# Patient Record
Sex: Male | Born: 1990 | Race: Black or African American | Hispanic: No | Marital: Single | Smoking: Current every day smoker
Health system: Southern US, Community
[De-identification: ages and names within clinical notes are randomized; demographics above are authoritative.]

## PROBLEM LIST (undated history)

## (undated) DIAGNOSIS — I10 Essential (primary) hypertension: Secondary | ICD-10-CM

## (undated) DIAGNOSIS — I639 Cerebral infarction, unspecified: Secondary | ICD-10-CM

## (undated) HISTORY — PX: NO PAST SURGERIES: SHX2092

---
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
## 2011-10-05 ENCOUNTER — Emergency Department: Payer: Self-pay | Admitting: Internal Medicine

## 2011-10-08 ENCOUNTER — Emergency Department: Payer: Self-pay | Admitting: Emergency Medicine

## 2015-02-27 ENCOUNTER — Encounter: Payer: Self-pay | Admitting: Student

## 2015-05-31 ENCOUNTER — Emergency Department
Admission: EM | Admit: 2015-05-31 | Discharge: 2015-05-31 | Disposition: A | Discharge status: Home / Self Care | Payer: Self-pay | Attending: Emergency Medicine | Admitting: Emergency Medicine

## 2015-05-31 ENCOUNTER — Encounter: Payer: Self-pay | Admitting: Emergency Medicine

## 2015-05-31 DIAGNOSIS — S29002A Unspecified injury of muscle and tendon of back wall of thorax, initial encounter: Secondary | ICD-10-CM | POA: Insufficient documentation

## 2015-05-31 DIAGNOSIS — I10 Essential (primary) hypertension: Secondary | ICD-10-CM | POA: Insufficient documentation

## 2015-05-31 DIAGNOSIS — Y990 Civilian activity done for income or pay: Secondary | ICD-10-CM | POA: Insufficient documentation

## 2015-05-31 DIAGNOSIS — Z720 Tobacco use: Secondary | ICD-10-CM | POA: Insufficient documentation

## 2015-05-31 DIAGNOSIS — Y9389 Activity, other specified: Secondary | ICD-10-CM | POA: Insufficient documentation

## 2015-05-31 DIAGNOSIS — M546 Pain in thoracic spine: Secondary | ICD-10-CM

## 2015-05-31 DIAGNOSIS — Y9289 Other specified places as the place of occurrence of the external cause: Secondary | ICD-10-CM | POA: Insufficient documentation

## 2015-05-31 DIAGNOSIS — X58XXXA Exposure to other specified factors, initial encounter: Secondary | ICD-10-CM | POA: Insufficient documentation

## 2015-05-31 LAB — CBC
HCT: 42.8 % (ref 40.0–52.0)
HEMOGLOBIN: 14.1 g/dL (ref 13.0–18.0)
MCH: 30.6 pg (ref 26.0–34.0)
MCHC: 32.9 g/dL (ref 32.0–36.0)
MCV: 93 fL (ref 80.0–100.0)
Platelets: 239 10*3/uL (ref 150–440)
RBC: 4.6 MIL/uL (ref 4.40–5.90)
RDW: 13.1 % (ref 11.5–14.5)
WBC: 6 10*3/uL (ref 3.8–10.6)

## 2015-05-31 LAB — BASIC METABOLIC PANEL
ANION GAP: 6 (ref 5–15)
BUN: 8 mg/dL (ref 6–20)
CALCIUM: 9.3 mg/dL (ref 8.9–10.3)
CO2: 28 mmol/L (ref 22–32)
CREATININE: 1.24 mg/dL (ref 0.61–1.24)
Chloride: 104 mmol/L (ref 101–111)
GFR calc non Af Amer: >60 mL/min (ref >60)
Glucose, Bld: 99 mg/dL (ref 65–99)
Potassium: 3.8 mmol/L (ref 3.5–5.1)
SODIUM: 138 mmol/L (ref 135–145)

## 2015-05-31 MED ORDER — NAPROXEN 500 MG PO TABS: 500.0000 mg | ORAL_TABLET | Freq: Two times a day (BID) | ORAL | Status: DC

## 2015-05-31 MED ORDER — LISINOPRIL 5 MG PO TABS
5.0000 mg | ORAL_TABLET | Freq: Once | ORAL | Status: AC
  Administered 2015-05-31: 5 mg via ORAL
  Filled 2015-05-31: qty 1

## 2015-05-31 MED ORDER — CYCLOBENZAPRINE HCL 10 MG PO TABS: 10.0000 mg | ORAL_TABLET | Freq: Three times a day (TID) | ORAL | Status: DC | PRN

## 2015-05-31 NOTE — ED Notes (Signed)
Patient to ED with c/o middle back pain, reports being sent here from work, reports doing a lot of lifting for work.

## 2015-05-31 NOTE — ED Provider Notes (Signed)
[facility] Emergency Department Provider Note  ____________________________________________  Time seen: On arrival  I have reviewed the triage vital signs and the nursing notes.   HISTORY  Chief Complaint Back Pain    HPI Tony Taylor is a 24 y.o. male who presents with complaints of back pain. He notes this developed at work this morning when he was lifting a pallet off of an elevated platform and twisting his back. He developed a sharp pain in the mid to lower back bilaterally which has not improved with stretching. He has no focal neuro deficits.I questioned him regarding his elevated blood pressure and he notes he has always had this but has not ever been put on medications for it. He denies chest pain, no headache.    History reviewed. No pertinent past medical history.  There are no active problems to display for this patient.   History reviewed. No pertinent past surgical history.  Current Outpatient Rx  Name  Route  Sig  Dispense  Refill  . cyclobenzaprine (FLEXERIL) 10 MG tablet   Oral   Take 1 tablet (10 mg total) by mouth every 8 (eight) hours as needed for muscle spasms.   20 tablet   1   . naproxen (NAPROSYN) 500 MG tablet   Oral   Take 1 tablet (500 mg total) by mouth 2 (two) times daily with a meal.   20 tablet   2     Allergies Review of patient's allergies indicates not on file.  History reviewed. No pertinent family history.  Social History Social History  Substance Use Topics  . Smoking status: Current Some Day Smoker  . Smokeless tobacco: None  . Alcohol Use: None    Review of Systems  Constitutional: Negative for fever. Eyes: Negative for visual changes. ENT: Negative for sore throat Musculoskeletal: Positive for back pain Skin: Negative for rash. Neurological: Negative for headaches or focal weakness   ____________________________________________   PHYSICAL EXAM:  VITAL SIGNS: ED Triage Vitals   Enc Vitals Group     BP 05/31/15 0934 197/119 mmHg     Pulse Rate 05/31/15 0934 58     Resp 05/31/15 0934 20     Temp 05/31/15 0934 98.3 F (36.8 C)     Temp Source 05/31/15 0934 Oral     SpO2 05/31/15 0934 100 %     Weight 05/31/15 0934 280 lb (127.007 kg)     Height 05/31/15 0934 6\' 5"  (1.956 m)     Head Cir --      Peak Flow --      Pain Score 05/31/15 1102 9     Pain Loc --      Pain Edu? --      Excl. in GC? --      Constitutional: Alert and oriented. Well appearing and in no distress. Eyes: Conjunctivae are normal.  ENT   Head: Normocephalic and atraumatic.   Mouth/Throat: Mucous membranes are moist. Cardiovascular: Normal rate, regular rhythm.  Respiratory: Normal respiratory effort without tachypnea nor retractions.  Gastrointestinal: Soft and non-tender in all quadrants. No distention. There is no CVA tenderness. Musculoskeletal: Nontender with normal range of motion in all extremities. Patient has tenderness bilateral inferior latissimus muscles. Full range of motion. No vertebral tenderness to palpation. Normal neuro exam Neurologic:  Normal speech and language. No gross focal neurologic deficits are appreciated. Skin:  Skin is warm, dry and intact. No rash noted. Psychiatric: Mood and affect are normal. Patient exhibits  appropriate insight and judgment.  ____________________________________________    LABS (pertinent positives/negatives)  Labs Reviewed  CBC  BASIC METABOLIC PANEL    ____________________________________________     ____________________________________________    RADIOLOGY I have personally reviewed any xrays that were ordered on this patient: None  ____________________________________________   PROCEDURES  Procedure(s) performed: none   ____________________________________________   INITIAL IMPRESSION / ASSESSMENT AND PLAN / ED COURSE  Pertinent labs & imaging results that were available during my care of the  patient were reviewed by me and considered in my medical decision making (see chart for details).  Patient with likely thoracic back strain. More concerning is his significantly elevated blood pressure. I had a long discussion with him regarding need for further treatment and how I will start him on antihypertensives here in the emergency department but he will need close follow-up. He agrees to follow-up closely  ____________________________________________   FINAL CLINICAL IMPRESSION(S) / ED DIAGNOSES  Final diagnoses:  Bilateral thoracic back pain  Essential hypertension     Jene Every, MD 05/31/15 [phone]

## 2015-05-31 NOTE — Discharge Instructions (Signed)
Back Pain, Adult °Low back pain is very common. About 1 in 5 people have back pain. The cause of low back pain is rarely dangerous. The pain often gets better over time. About half of people with a sudden onset of back pain feel better in just 2 weeks. About 8 in 10 people feel better by 6 weeks.  °CAUSES °Some common causes of back pain include: °· Strain of the muscles or ligaments supporting the spine. °· Wear and tear (degeneration) of the spinal discs. °· Arthritis. °· Direct injury to the back. °DIAGNOSIS °Most of the time, the direct cause of low back pain is not known. However, back pain can be treated effectively even when the exact cause of the pain is unknown. Answering your caregiver's questions about your overall health and symptoms is one of the most accurate ways to make sure the cause of your pain is not dangerous. If your caregiver needs more information, he or she may order lab work or imaging tests (X-rays or MRIs). However, even if imaging tests show changes in your back, this usually does not require surgery. °HOME CARE INSTRUCTIONS °For many people, back pain returns. Since low back pain is rarely dangerous, it is often a condition that people can learn to manage on their own.  °· Remain active. It is stressful on the back to sit or stand in one place. Do not sit, drive, or stand in one place for more than 30 minutes at a time. Take short walks on level surfaces as soon as pain allows. Try to increase the length of time you walk each day. °· Do not stay in bed. Resting more than 1 or 2 days can delay your recovery. °· Do not avoid exercise or work. Your body is made to move. It is not dangerous to be active, even though your back may hurt. Your back will likely heal faster if you return to being active before your pain is gone. °· Pay attention to your body when you  bend and lift. Many people have less discomfort when lifting if they bend their knees, keep the load close to their bodies, and  avoid twisting. Often, the most comfortable positions are those that put less stress on your recovering back. °· Find a comfortable position to sleep. Use a firm mattress and lie on your side with your knees slightly bent. If you lie on your back, put a pillow under your knees. °· Only take over-the-counter or prescription medicines as directed by your caregiver. Over-the-counter medicines to reduce pain and inflammation are often the most helpful. Your caregiver may prescribe muscle relaxant drugs. These medicines help dull your pain so you can more quickly return to your normal activities and healthy exercise. °· Put ice on the injured area. °¨ Put ice in a plastic bag. °¨ Place a towel between your skin and the bag. °¨ Leave the ice on for 15-20 minutes, 03-04 times a day for the first 2 to 3 days. After that, ice and heat may be alternated to reduce pain and spasms. °· Ask your caregiver about trying back exercises and gentle massage. This may be of some benefit. °· Avoid feeling anxious or stressed. Stress increases muscle tension and can worsen back pain. It is important to recognize when you are anxious or stressed and learn ways to manage it. Exercise is a great option. °SEEK MEDICAL CARE IF: °· You have pain that is not relieved with rest or medicine. °· You have pain that does not improve in 1 week. °· You have new symptoms. °· You are generally not feeling well. °SEEK   IMMEDIATE MEDICAL CARE IF:  °· You have pain that radiates from your back into your legs. °· You develop new bowel or bladder control problems. °· You have unusual weakness or numbness in your arms or legs. °· You develop nausea or vomiting. °· You develop abdominal pain. °· You feel faint. °Document Released: 09/29/2005 Document Revised: 03/30/2012 Document Reviewed: 01/31/2014 °ExitCare® Patient Information ©2015 [employer]. This information is not intended to replace advice given to you by your health care provider. Make sure you  discuss any questions you have with your health care provider. °Hypertension °Hypertension, commonly called high blood pressure, is when the force of blood pumping through your arteries is too strong. Your arteries are the blood vessels that carry blood from your heart throughout your body. A blood pressure reading consists of a higher number over a lower number, such as 110/72. The higher number (systolic) is the pressure inside your arteries when your heart pumps. The lower number (diastolic) is the pressure inside your arteries when your heart relaxes. Ideally you want your blood pressure below 120/80. °Hypertension forces your heart to work harder to pump blood. Your arteries may become narrow or stiff. Having hypertension puts you at risk for heart disease, stroke, and other problems.  °RISK FACTORS °Some risk factors for high blood pressure are controllable. Others are not.  °Risk factors you cannot control include:  °· Race. You may be at higher risk if you are African American. °· Age. Risk increases with age. °· Gender. Men are at higher risk than women before age 45 years. After age 65, women are at higher risk than men. °Risk factors you can control include: °· Not getting enough exercise or physical activity. °· Being overweight. °· Getting too much fat, sugar, calories, or salt in your diet. °· Drinking too much alcohol. °SIGNS AND SYMPTOMS °Hypertension does not usually cause signs or symptoms. Extremely high blood pressure (hypertensive crisis) may cause headache, anxiety, shortness of breath, and nosebleed. °DIAGNOSIS  °To check if you have hypertension, your health care provider will measure your blood pressure while you are seated, with your arm held at the level of your heart. It should be measured at least twice using the same arm. Certain conditions can cause a difference in blood pressure between your right and left arms. A blood pressure reading that is higher than normal on one occasion does  not mean that you need treatment. If one blood pressure reading is high, ask your health care provider about having it checked again. °TREATMENT  °Treating high blood pressure includes making lifestyle changes and possibly taking medicine. Living a healthy lifestyle can help lower high blood pressure. You may need to change some of your habits. °Lifestyle changes may include: °· Following the DASH diet. This diet is high in fruits, vegetables, and whole grains. It is low in salt, red meat, and added sugars. °· Getting at least 2½ hours of brisk physical activity every week. °· Losing weight if necessary. °· Not smoking. °· Limiting alcoholic beverages. °· Learning ways to reduce stress. ° If lifestyle changes are not enough to get your blood pressure under control, your health care provider may prescribe medicine. You may need to take more than one. Work closely with your health care provider to understand the risks and benefits. °HOME CARE INSTRUCTIONS °· Have your blood pressure rechecked as directed by your health care provider.   °· Take medicines only as directed by your health care provider. Follow the directions carefully. Blood   pressure medicines must be taken as prescribed. The medicine does not work as well when you skip doses. Skipping doses also puts you at risk for problems.   °· Do not smoke.   °· Monitor your blood pressure at home as directed by your health care provider.  °SEEK MEDICAL CARE IF:  °· You think you are having a reaction to medicines taken. °· You have recurrent headaches or feel dizzy. °· You have swelling in your ankles. °· You have trouble with your vision. °SEEK IMMEDIATE MEDICAL CARE IF: °· You develop a severe headache or confusion. °· You have unusual weakness, numbness, or feel faint. °· You have severe chest or abdominal pain. °· You vomit repeatedly. °· You have trouble breathing. °MAKE SURE YOU:  °· Understand these instructions. °· Will watch your condition. °· Will get help  right away if you are not doing well or get worse. °Document Released: 09/29/2005 Document Revised: 02/13/2014 Document Reviewed: 07/22/2013 °ExitCare® Patient Information ©2015 [employer]. This information is not intended to replace advice given to you by your health care provider. Make sure you discuss any questions you have with your health care provider. ° °

## 2015-07-16 ENCOUNTER — Other Ambulatory Visit: Payer: Self-pay

## 2015-07-16 ENCOUNTER — Inpatient Hospital Stay
Admission: EM | Admit: 2015-07-16 | Discharge: 2015-07-17 | DRG: 065 | Disposition: A | Discharge status: Home / Self Care | Payer: Self-pay | Attending: Internal Medicine | Admitting: Internal Medicine

## 2015-07-16 ENCOUNTER — Emergency Department: Payer: Self-pay

## 2015-07-16 DIAGNOSIS — E785 Hyperlipidemia, unspecified: Secondary | ICD-10-CM | POA: Diagnosis present

## 2015-07-16 DIAGNOSIS — G8194 Hemiplegia, unspecified affecting left nondominant side: Secondary | ICD-10-CM | POA: Diagnosis present

## 2015-07-16 DIAGNOSIS — I639 Cerebral infarction, unspecified: Principal | ICD-10-CM | POA: Diagnosis present

## 2015-07-16 DIAGNOSIS — I161 Hypertensive emergency: Secondary | ICD-10-CM

## 2015-07-16 DIAGNOSIS — R531 Weakness: Secondary | ICD-10-CM

## 2015-07-16 DIAGNOSIS — Z8673 Personal history of transient ischemic attack (TIA), and cerebral infarction without residual deficits: Secondary | ICD-10-CM

## 2015-07-16 DIAGNOSIS — F17210 Nicotine dependence, cigarettes, uncomplicated: Secondary | ICD-10-CM | POA: Diagnosis present

## 2015-07-16 DIAGNOSIS — I10 Essential (primary) hypertension: Secondary | ICD-10-CM | POA: Diagnosis present

## 2015-07-16 DIAGNOSIS — M545 Low back pain: Secondary | ICD-10-CM | POA: Diagnosis present

## 2015-07-16 HISTORY — DX: Cerebral infarction, unspecified: I63.9

## 2015-07-16 HISTORY — DX: Essential (primary) hypertension: I10

## 2015-07-16 LAB — COMPREHENSIVE METABOLIC PANEL
ALK PHOS: 91 U/L (ref 38–126)
ALT: 23 U/L (ref 17–63)
ANION GAP: 8 (ref 5–15)
AST: 19 U/L (ref 15–41)
Albumin: 4.3 g/dL (ref 3.5–5.0)
BUN: 12 mg/dL (ref 6–20)
CALCIUM: 9.2 mg/dL (ref 8.9–10.3)
CHLORIDE: 102 mmol/L (ref 101–111)
CO2: 26 mmol/L (ref 22–32)
Creatinine, Ser: 1.25 mg/dL — ABNORMAL HIGH (ref 0.61–1.24)
GFR calc non Af Amer: >60 mL/min (ref >60)
Glucose, Bld: 94 mg/dL (ref 65–99)
Potassium: 3.7 mmol/L (ref 3.5–5.1)
SODIUM: 136 mmol/L (ref 135–145)
Total Bilirubin: 0.7 mg/dL (ref 0.3–1.2)
Total Protein: 7.3 g/dL (ref 6.5–8.1)

## 2015-07-16 LAB — CBC
HCT: 45.6 % (ref 40.0–52.0)
Hemoglobin: 15.3 g/dL (ref 13.0–18.0)
MCH: 30.5 pg (ref 26.0–34.0)
MCHC: 33.5 g/dL (ref 32.0–36.0)
MCV: 91 fL (ref 80.0–100.0)
PLATELETS: 235 10*3/uL (ref 150–440)
RBC: 5.01 MIL/uL (ref 4.40–5.90)
RDW: 12.7 % (ref 11.5–14.5)
WBC: 7.3 10*3/uL (ref 3.8–10.6)

## 2015-07-16 LAB — PROTIME-INR
INR: 0.93
PROTHROMBIN TIME: 12.7 s (ref 11.4–15.0)

## 2015-07-16 LAB — DIFFERENTIAL
BASOS PCT: 1 %
Basophils Absolute: 0.1 10*3/uL (ref 0–0.1)
EOS PCT: 3 %
Eosinophils Absolute: 0.2 10*3/uL (ref 0–0.7)
Lymphocytes Relative: 25 %
Lymphs Abs: 1.8 10*3/uL (ref 1.0–3.6)
MONO ABS: 0.5 10*3/uL (ref 0.2–1.0)
Monocytes Relative: 8 %
Neutro Abs: 4.6 10*3/uL (ref 1.4–6.5)
Neutrophils Relative %: 63 %

## 2015-07-16 LAB — TROPONIN I

## 2015-07-16 LAB — APTT: aPTT: 29 seconds (ref 24–36)

## 2015-07-16 MED ORDER — STROKE: EARLY STAGES OF RECOVERY BOOK
Freq: Once | Status: AC
  Administered 2015-07-17

## 2015-07-16 MED ORDER — ACETAMINOPHEN 325 MG PO TABS: 650.0000 mg | ORAL_TABLET | Freq: Four times a day (QID) | ORAL | Status: DC | PRN

## 2015-07-16 MED ORDER — SODIUM CHLORIDE 0.9 % IJ SOLN
3.0000 mL | Freq: Two times a day (BID) | INTRAMUSCULAR | Status: DC
  Administered 2015-07-17 (×2): 3 mL via INTRAVENOUS

## 2015-07-16 MED ORDER — LABETALOL HCL 5 MG/ML IV SOLN
20.0000 mg | Freq: Once | INTRAVENOUS | Status: AC
  Administered 2015-07-16: 20 mg via INTRAVENOUS
  Filled 2015-07-16: qty 4

## 2015-07-16 MED ORDER — ENOXAPARIN SODIUM 40 MG/0.4ML ~~LOC~~ SOLN
40.0000 mg | SUBCUTANEOUS | Status: DC
  Administered 2015-07-17: 40 mg via SUBCUTANEOUS
  Filled 2015-07-16: qty 0.4

## 2015-07-16 MED ORDER — ACETAMINOPHEN 650 MG RE SUPP: 650.0000 mg | Freq: Four times a day (QID) | RECTAL | Status: DC | PRN

## 2015-07-16 MED ORDER — LABETALOL HCL 5 MG/ML IV SOLN
10.0000 mg | INTRAVENOUS | Status: DC | PRN
Start: 2015-07-16 — End: 2015-07-17
  Filled 2015-07-16: qty 4

## 2015-07-16 NOTE — ED Notes (Signed)
Pt c/o waking on Saturday morning with numbness to left side of body, states since it has progress with some difficulty with grip and ambulating per pt, pt ambulatory to triage without difficulty. No facial/neuro deficits noted in triage..denies HA blurred vision or other sx.Marland Kitchen

## 2015-07-16 NOTE — ED Notes (Signed)
Pt to ed with c/o weakness and numbness to his left side. Pt states his grip is weak and feels like his left arm and leg are "asleep". Pt states "my speech is also off", pt states "my blood pressure is high and and I'm worried I had a stroke in my sleep 2 nights ago" pt states his mom recently had a stroke and is worried he has the same fate.

## 2015-07-16 NOTE — Plan of Care (Signed)
Problem: Discharge/Transitional Outcomes Goal: Barriers To Progression Addressed/Resolved Individualization: Pt prefers to be called Tony Taylor who lives at home with his father. Mother lives in assisted living due to multiple strokes in the past. Hx HTN but does not take any home medications. Low fall risk. Pt understands how to use call system for assistance.

## 2015-07-16 NOTE — ED Notes (Signed)
Pt to 1C via stretcher w/portable telemetry monitor on.

## 2015-07-16 NOTE — ED Provider Notes (Signed)
[facility] Emergency Department Provider Note   ____________________________________________  Time seen: 7:30 PM I have reviewed the triage vital signs and the triage nursing note.  HISTORY  Chief Complaint Stroke Symptoms   Historian Patient, with dad at bedside  HPI Tony Taylor is a 24 y.o. male who reports being diagnosed with elevated blood pressure couple months ago when he was having low back pain, and there was thought that he was having elevated blood pressure due to pain. He is here today for evaluation of tingling in the left arm and left leg, decreased grip strength and trouble walking due to weakness in the left leg. He woke up Saturday morning with the symptoms. Symptoms have not gone away. They're not waxing and waning. No headache. No chest pain or trouble breathing. No urinary symptoms. Mother had a stroke in her 21s.    Past Medical History  Diagnosis Date  . Hypertension     There are no active problems to display for this patient.   History reviewed. No pertinent past surgical history.  Current Outpatient Rx  Name  Route  Sig  Dispense  Refill  . cyclobenzaprine (FLEXERIL) 10 MG tablet   Oral   Take 1 tablet (10 mg total) by mouth every 8 (eight) hours as needed for muscle spasms.   20 tablet   1   . naproxen (NAPROSYN) 500 MG tablet   Oral   Take 1 tablet (500 mg total) by mouth 2 (two) times daily with a meal.   20 tablet   2     Allergies Review of patient's allergies indicates no known allergies.  No family history on file.  Social History Social History  Substance Use Topics  . Smoking status: Current Every Day Smoker    Types: Cigarettes  . Smokeless tobacco: Never Used  . Alcohol Use: No    Review of Systems  Constitutional: Negative for fever. Eyes: Negative for visual changes. ENT: Negative for sore throat. Cardiovascular: Negative for chest pain. Respiratory: Negative for shortness of  breath. Gastrointestinal: Negative for abdominal pain, vomiting and diarrhea. Genitourinary: Negative for dysuria. Musculoskeletal: Negative for back pain. Skin: Negative for rash. Neurological: Negative for headache. 10 point Review of Systems otherwise negative ____________________________________________   PHYSICAL EXAM:  VITAL SIGNS: ED Triage Vitals  Enc Vitals Group     BP 07/16/15 1654 190/116 mmHg     Pulse Rate 07/16/15 1654 68     Resp 07/16/15 1654 18     Temp 07/16/15 1654 97.8 F (36.6 C)     Temp Source 07/16/15 1654 Oral     SpO2 07/16/15 1654 100 %     Weight 07/16/15 1654 280 lb (127.007 kg)     Height 07/16/15 1654  (1.956 m)     Head Cir --      Peak Flow --      Pain Score --      Pain Loc --      Pain Edu? --      Excl. in GC? --      Constitutional: Alert and oriented. Well appearing and in no distress. Eyes: Conjunctivae are normal. PERRL. Normal extraocular movements. ENT   Head: Normocephalic and atraumatic.   Nose: No congestion/rhinnorhea.   Mouth/Throat: Mucous membranes are moist.   Neck: No stridor. Cardiovascular/Chest: Normal rate, regular rhythm.  No murmurs, rubs, or gallops. Respiratory: Normal respiratory effort without tachypnea nor retractions. Breath sounds are clear and equal bilaterally.  No wheezes/rales/rhonchi. Gastrointestinal: Soft. No distention, no guarding, no rebound. Nontender   Genitourinary/rectal:Deferred Musculoskeletal: Nontender with normal range of motion in all extremities. No joint effusions.  No lower extremity tenderness.  No edema. Neurologic:  Normal speech and language. No aphasia or dysarthria. 4 out of 5 strength in the left upper extremity triceps. 5 out of 5 strength on exam in 3 other extremities. No facial droop.  Cranial nerves II through X intact. No sensory changes. Skin:  Skin is warm, dry and intact. No rash noted. Psychiatric: Mood and affect are normal. Speech and behavior  are normal. Patient exhibits appropriate insight and judgment.  ____________________________________________   EKG I, Governor Rooks, MD, the attending physician have personally viewed and interpreted all ECGs.  56 bpm. Sinus bradycardia. Narrow QRS. LVH. Nonspecific T wave. ____________________________________________  LABS (pertinent positives/negatives)  INR 0.93 CBC within normal limits Comprehensive metabolic panel significant for creatinine 1.25 Troponin less than 0.03  ____________________________________________  RADIOLOGY All Xrays were viewed by me. Imaging interpreted by Radiologist.  CT head noncontrast: No acute intracranial pathology __________________________________________  PROCEDURES  Procedure(s) performed: None  Critical Care performed: CRITICAL CARE Performed by: Governor Rooks   Total critical care time: 30 minutes  Critical care time was exclusive of separately billable procedures and treating other patients.  Critical care was necessary to treat or prevent imminent or life-threatening deterioration.  Critical care was time spent personally by me on the following activities: development of treatment plan with patient and/or surrogate as well as nursing, discussions with consultants, evaluation of patient's response to treatment, examination of patient, obtaining history from patient or surrogate, ordering and performing treatments and interventions, ordering and review of laboratory studies, ordering and review of radiographic studies, pulse oximetry and re-evaluation of patient's condition.   ____________________________________________   ED COURSE / ASSESSMENT AND PLAN  CONSULTATIONS: Hospitalist for admission  Pertinent labs & imaging results that were available during my care of the patient were reviewed by me and considered in my medical decision making (see chart for details).  Patient arrived room with complaint of neurologic deficit,  which was onset 2 days ago.  CT head negative for sign of stroke, bleed, or ischemic stroke. Patient would not be a TPA candidate due to minor symptoms, and length of symptoms if this were an ischemic stroke. Patient's blood pressure is significantly elevated, and I suspect the patient's neurologic symptoms may be coming from elevated blood pressure consistent with hypertensive emergency. Patient was given IV labetalol 20 mg in the emergency department. We discussed dropping the blood pressure by 25%. He will be admitted for management of blood pressure and further evaluation of the neurologic symptoms.  Patient's creatinine is also minimally elevated at 1.27. This may be a result of chronic/prolonged hypertension as well.  Patient's blood pressure down to 150/90 after one dose of labetalol 20 mg IV. Patient still symptomatic with left-sided weakness. I discussed with the hospitalist for admission.  Patient / Family / Caregiver informed of clinical course, medical decision-making process, and agree with plan.    ___________________________________________   FINAL CLINICAL IMPRESSION(S) / ED DIAGNOSES   Final diagnoses:  Hypertensive emergency  Left-sided weakness       Governor Rooks, MD 07/16/15 2044

## 2015-07-16 NOTE — H&P (Signed)
[facility] [place] at [facility]   PATIENT NAME: Tony Taylor    MR#:  [id number]  DATE OF BIRTH:  03/17/1991  DATE OF ADMISSION:  07/16/2015  PRIMARY CARE PHYSICIAN: No PCP Per Patient   REQUESTING/REFERRING PHYSICIAN: Shaune Pollack, M.D.  CHIEF COMPLAINT:   Chief Complaint  Patient presents with  . Stroke Symptoms    HISTORY OF PRESENT ILLNESS:  Tony Taylor  is a 24 y.o. male who presents with left hemiparesis. Patient states that he began to notice subtle left leg and arm weakness 3 days ago. His symptoms progressed each day feeling a little bit weaker he states that currently his left arm is noticeably more weaker compared to his left leg. He also says that he seemed to have a little bit of difficulty with his speech, that he feels he is speaking a little slower and having to concentrate a little bit more to pronounce his words clearly. He denies any dysphagia, denies any acute headache, visual changes, sensory deficits. See full review of systems below. He also denies any infectious symptoms. Has a history only of hypertension, not currently treated with any medications. In the ED workup is largely benign, including CT head. However, on exam he does still have some perceptible left-sided weakness, so hospitalists were called for admission. His blood pressure is also significantly elevated in the ED.  PAST MEDICAL HISTORY:   Past Medical History  Diagnosis Date  . Hypertension     PAST SURGICAL HISTORY:   Past Surgical History  Procedure Laterality Date  . No past surgeries      SOCIAL HISTORY:   Social History  Substance Use Topics  . Smoking status: Current Every Day Smoker    Types: Cigarettes  . Smokeless tobacco: Never Used  . Alcohol Use: No    FAMILY HISTORY:   Family History  Problem Relation Age of Onset  . Stroke Mother     DRUG ALLERGIES:  No Known Allergies  MEDICATIONS AT HOME:   Prior to Admission medications   Not  on File    REVIEW OF SYSTEMS:  Review of Systems  Constitutional: Negative for fever, chills, weight loss and malaise/fatigue.  HENT: Negative for ear pain, hearing loss and tinnitus.   Eyes: Negative for blurred vision, double vision, pain and redness.  Respiratory: Negative for cough, hemoptysis and shortness of breath.   Cardiovascular: Negative for chest pain, palpitations, orthopnea and leg swelling.  Gastrointestinal: Negative for nausea, vomiting, abdominal pain, diarrhea and constipation.  Genitourinary: Negative for dysuria, frequency and hematuria.  Musculoskeletal: Negative for back pain, joint pain and neck pain.  Skin:       No acne, rash, or lesions  Neurological: Positive for speech change and focal weakness. Negative for dizziness, tremors and weakness.  Endo/Heme/Allergies: Negative for polydipsia. Does not bruise/bleed easily.  Psychiatric/Behavioral: Negative for depression. The patient is not nervous/anxious and does not have insomnia.      VITAL SIGNS:   Filed Vitals:   07/16/15 1911 07/16/15 1930 07/16/15 2038 07/16/15 2100  BP: 184/127 162/94 142/98 166/110  Pulse: 62 94 66 59  Temp: 98 F (36.7 C)  98.3 F (36.8 C)   TempSrc:      Resp: Height:      Weight:      SpO2: 100% 100% 100% 100%   Wt Readings from Last 3 Encounters:  07/16/15 127.007 kg (280 lb)  05/31/15 127.007 kg (280 lb)  PHYSICAL EXAMINATION:  Physical Exam  Vitals reviewed. Constitutional: He is oriented to person, place, and time. He appears well-developed and well-nourished. No distress.  HENT:  Head: Normocephalic and atraumatic.  Mouth/Throat: Oropharynx is clear and moist.  Eyes: Conjunctivae and EOM are normal. Pupils are equal, round, and reactive to light. No scleral icterus.  Neck: Normal range of motion. Neck supple. No JVD present. No thyromegaly present.  Cardiovascular: Normal rate, regular rhythm and intact distal pulses.  Exam reveals no gallop and  no friction rub.   No murmur heard. Respiratory: Effort normal and breath sounds normal. No respiratory distress. He has no wheezes. He has no rales.  GI: Soft. Bowel sounds are normal. He exhibits no distension. There is no tenderness.  Musculoskeletal: Normal range of motion. He exhibits no edema.  No arthritis, no gout  Lymphadenopathy:    He has no cervical adenopathy.  Neurological: He is alert and oriented to person, place, and time.  Neurologic: Cranial nerves II-XII intact, Sensation intact to light touch/pinprick, 5/5 strength in right extremities, 4/5 strength left upper extremity, 5/5 strength left lower extremity though very subtly weaker compared to the right, no dysarthria, no aphasia, no dysphagia, memory intact, finger to nose testing showed no abnormality, very subtle left pronator drift, DTR intact, Babinski sign not present, gait deferred   Skin: Skin is warm and dry. No rash noted. No erythema.  Psychiatric: He has a normal mood and affect. His behavior is normal. Judgment and thought content normal.    LABORATORY PANEL:   CBC  Recent Labs Lab 07/16/15 1706  WBC 7.3  HGB 15.3  HCT 45.6  PLT 235   ------------------------------------------------------------------------------------------------------------------  Chemistries   Recent Labs Lab 07/16/15 1706  NA 136  K 3.7  CL 102  CO2 26  GLUCOSE 94  BUN 12  CREATININE 1.25*  CALCIUM 9.2  AST 19  ALT 23  ALKPHOS 91  BILITOT 0.7   ------------------------------------------------------------------------------------------------------------------  Cardiac Enzymes  Recent Labs Lab 07/16/15 1706  TROPONINI <0.03   ------------------------------------------------------------------------------------------------------------------  RADIOLOGY:  Ct Head Wo Contrast  07/16/2015   CLINICAL DATA:  Left-sided numbness for 2 days  EXAM: CT HEAD WITHOUT CONTRAST  TECHNIQUE: Contiguous axial images were  obtained from the base of the skull through the vertex without intravenous contrast.  COMPARISON:  None.  FINDINGS: Brain parenchyma and ventricular system are within normal limits. No mass effect, midline shift, or acute hemorrhage. Mastoid air cells are clear. Paranasal sinuses are clear. Adenoidal lymphoid tissue is moderately prominent.  IMPRESSION: No acute intracranial pathology.   Electronically Signed   By: Jolaine Click M.D.   On: 07/16/2015 17:13    EKG:   Orders placed or performed during the hospital encounter of 07/16/15  . ED EKG  . ED EKG    IMPRESSION AND PLAN:  Principal Problem:   Left hemiparesis (HCC) - concern is for possible stroke. Patient is very young, however he does have persistent hemiparesis on the left side, even if mild. Lab workup largely benign. We'll order MRI/MRA head with MRA neck, fasting lipid panel, hemoglobin A1c, echocardiogram, PT/OT/speech therapy, and neurology consult. Active Problems:   Accelerated hypertension - given the patient is greater than 24 hours out from onset of symptoms, we will control his blood pressure to a goal of less than 160/100. Continues IV medications when necessary to control this for now. He will likely need initiation of by mouth antihypertensive.  All the records are reviewed and case discussed with  ED provider. Management plans discussed with the patient and/or family.  DVT PROPHYLAXIS: SubQ lovenox  ADMISSION STATUS: Inpatient  CODE STATUS: Full  TOTAL TIME TAKING CARE OF THIS PATIENT: 45 minutes.    Alonzo Loving FIELDING 07/16/2015, 10:14 PM  [employer] [facility]  Office  [phone]  CC: Primary care physician; No PCP Per Patient

## 2015-07-17 ENCOUNTER — Inpatient Hospital Stay
Admit: 2015-07-17 | Discharge: 2015-07-17 | Disposition: A | Discharge status: Home / Self Care | Payer: Self-pay | Attending: Internal Medicine | Admitting: Internal Medicine

## 2015-07-17 ENCOUNTER — Encounter: Payer: Self-pay | Admitting: Internal Medicine

## 2015-07-17 ENCOUNTER — Inpatient Hospital Stay: Payer: Self-pay

## 2015-07-17 LAB — LIPID PANEL
CHOL/HDL RATIO: 4.9 ratio
CHOLESTEROL: 196 mg/dL (ref 0–200)
HDL: 40 mg/dL — ABNORMAL LOW (ref >40)
LDL Cholesterol: 137 mg/dL — ABNORMAL HIGH (ref 0–99)
Triglycerides: 97 mg/dL (ref <150)
VLDL: 19 mg/dL (ref 0–40)

## 2015-07-17 LAB — CBC
HCT: 44 % (ref 40.0–52.0)
Hemoglobin: 14.9 g/dL (ref 13.0–18.0)
MCH: 30.8 pg (ref 26.0–34.0)
MCHC: 33.9 g/dL (ref 32.0–36.0)
MCV: 90.8 fL (ref 80.0–100.0)
PLATELETS: 203 10*3/uL (ref 150–440)
RBC: 4.84 MIL/uL (ref 4.40–5.90)
RDW: 12.8 % (ref 11.5–14.5)
WBC: 7.2 10*3/uL (ref 3.8–10.6)

## 2015-07-17 LAB — TROPONIN I

## 2015-07-17 LAB — VITAMIN B12: VITAMIN B 12: 126 pg/mL — AB (ref 180–914)

## 2015-07-17 LAB — HEMOGLOBIN A1C: Hgb A1c MFr Bld: 5.4 % (ref 4.0–6.0)

## 2015-07-17 MED ORDER — AMLODIPINE BESYLATE 5 MG PO TABS
5.0000 mg | ORAL_TABLET | Freq: Every day | ORAL | Status: DC
  Administered 2015-07-17: 5 mg via ORAL
  Filled 2015-07-17: qty 1

## 2015-07-17 MED ORDER — AMLODIPINE BESYLATE 5 MG PO TABS: 10.0000 mg | ORAL_TABLET | Freq: Every day | ORAL | Status: DC

## 2015-07-17 MED ORDER — ATORVASTATIN CALCIUM 10 MG PO TABS: 40.0000 mg | ORAL_TABLET | Freq: Every day | ORAL | Status: DC

## 2015-07-17 MED ORDER — ASPIRIN 81 MG PO CHEW: 81.0000 mg | CHEWABLE_TABLET | Freq: Every day | ORAL | Status: DC

## 2015-07-17 MED ORDER — ATORVASTATIN CALCIUM 20 MG PO TABS
40.0000 mg | ORAL_TABLET | Freq: Every day | ORAL | Status: DC
  Administered 2015-07-17: 15:00:00 40 mg via ORAL
  Filled 2015-07-17: qty 2

## 2015-07-17 MED ORDER — ASPIRIN 81 MG PO CHEW
81.0000 mg | CHEWABLE_TABLET | Freq: Every day | ORAL | Status: DC
  Administered 2015-07-17: 12:00:00 81 mg via ORAL
  Filled 2015-07-17: qty 1

## 2015-07-17 MED ORDER — ALTEPLASE 2 MG IJ SOLR
INTRAMUSCULAR | Status: AC
  Filled 2015-07-17: qty 8

## 2015-07-17 MED ORDER — HYDRALAZINE HCL 50 MG PO TABS
25.0000 mg | ORAL_TABLET | Freq: Three times a day (TID) | ORAL | Status: DC
  Administered 2015-07-17: 09:00:00 25 mg via ORAL
  Filled 2015-07-17: qty 1

## 2015-07-17 MED ORDER — AMLODIPINE BESYLATE 5 MG PO TABS: 5.0000 mg | ORAL_TABLET | Freq: Every day | ORAL | Status: DC

## 2015-07-17 MED ORDER — NICOTINE 14 MG/24HR TD PT24: 14.0000 mg | MEDICATED_PATCH | Freq: Every day | TRANSDERMAL | Status: DC

## 2015-07-17 MED ORDER — HYDRALAZINE HCL 20 MG/ML IJ SOLN
10.0000 mg | INTRAMUSCULAR | Status: DC | PRN
  Administered 2015-07-17 (×2): 10 mg via INTRAVENOUS
  Filled 2015-07-17 (×2): qty 1

## 2015-07-17 MED ORDER — GADOBENATE DIMEGLUMINE 529 MG/ML IV SOLN
20.0000 mL | Freq: Once | INTRAVENOUS | Status: AC | PRN
  Administered 2015-07-17: 10:00:00 20 mL via INTRAVENOUS

## 2015-07-17 NOTE — Consult Note (Signed)
Reason for Consult: stroke Referring Physician:  Dr. Alfonso Ellis is an 24 y.o. male.  HPI: seen at request of Dr. Benjie Karvonen secondary for stroke;  24 yo RHD M presents to [facility] secondary to weakness on the L for the past 3 days.  Pt has never had an episode like this before and is concerned.  He does note that his mom had 4 strokes and everyone has HTN in his family.  He never felt that his BP was elevated until he got hurt recently.  Past Medical History  Diagnosis Date  . Hypertension   . CVA (cerebral infarction)     Past Surgical History  Procedure Laterality Date  . No past surgeries      Family History  Problem Relation Age of Onset  . Stroke Mother     Social History:  reports that he has been smoking Cigarettes.  He has never used smokeless tobacco. He reports that he does not drink alcohol or use illicit drugs.  Allergies: No Known Allergies  Medications:  Personally reviewed by me as per chart  Results for orders placed or performed during the hospital encounter of 07/16/15 (from the past 48 hour(s))  Protime-INR     Status: None   Collection Time: 07/16/15  5:06 PM  Result Value Ref Range   Prothrombin Time 12.7 11.4 - 15.0 seconds   INR 0.93   APTT     Status: None   Collection Time: 07/16/15  5:06 PM  Result Value Ref Range   aPTT 29 24 - 36 seconds  CBC     Status: None   Collection Time: 07/16/15  5:06 PM  Result Value Ref Range   WBC 7.3 3.8 - 10.6 K/uL   RBC 5.01 4.40 - 5.90 MIL/uL   Hemoglobin 15.3 13.0 - 18.0 g/dL   HCT 45.6 40.0 - 52.0 %   MCV 91.0 80.0 - 100.0 fL   MCH 30.5 26.0 - 34.0 pg   MCHC 33.5 32.0 - 36.0 g/dL   RDW 12.7 11.5 - 14.5 %   Platelets 235 150 - 440 K/uL  Differential     Status: None   Collection Time: 07/16/15  5:06 PM  Result Value Ref Range   Neutrophils Relative % 63 %   Neutro Abs 4.6 1.4 - 6.5 K/uL   Lymphocytes Relative 25 %   Lymphs Abs 1.8 1.0 - 3.6 K/uL   Monocytes Relative 8 %   Monocytes Absolute 0.5  0.2 - 1.0 K/uL   Eosinophils Relative 3 %   Eosinophils Absolute 0.2 0 - 0.7 K/uL   Basophils Relative 1 %   Basophils Absolute 0.1 0 - 0.1 K/uL  Comprehensive metabolic panel     Status: Abnormal   Collection Time: 07/16/15  5:06 PM  Result Value Ref Range   Sodium 136 135 - 145 mmol/L   Potassium 3.7 3.5 - 5.1 mmol/L   Chloride 102 101 - 111 mmol/L   CO2 26 22 - 32 mmol/L   Glucose, Bld 94 65 - 99 mg/dL   BUN 12 6 - 20 mg/dL   Creatinine, Ser 1.25 (H) 0.61 - 1.24 mg/dL   Calcium 9.2 8.9 - 10.3 mg/dL   Total Protein 7.3 6.5 - 8.1 g/dL   Albumin 4.3 3.5 - 5.0 g/dL   AST 19 15 - 41 U/L   ALT 23 17 - 63 U/L   Alkaline Phosphatase 91 38 - 126 U/L   Total Bilirubin 0.7  0.3 - 1.2 mg/dL   GFR calc non Af Amer >60 >60 mL/min   GFR calc Af Amer >60 >60 mL/min    Comment: (NOTE) The eGFR has been calculated using the CKD EPI equation. This calculation has not been validated in all clinical situations. eGFR's persistently <60 mL/min signify possible Chronic Kidney Disease.    Anion gap 8 5 - 15  Troponin I     Status: None   Collection Time: 07/16/15  5:06 PM  Result Value Ref Range   Troponin I <0.03 <0.031 ng/mL    Comment:        NO INDICATION OF MYOCARDIAL INJURY.   Troponin I     Status: None   Collection Time: 07/17/15 12:10 AM  Result Value Ref Range   Troponin I <0.03 <0.031 ng/mL    Comment:        NO INDICATION OF MYOCARDIAL INJURY.   Troponin I     Status: None   Collection Time: 07/17/15  5:13 AM  Result Value Ref Range   Troponin I <0.03 <0.031 ng/mL    Comment:        NO INDICATION OF MYOCARDIAL INJURY.   CBC     Status: None   Collection Time: 07/17/15  5:13 AM  Result Value Ref Range   WBC 7.2 3.8 - 10.6 K/uL   RBC 4.84 4.40 - 5.90 MIL/uL   Hemoglobin 14.9 13.0 - 18.0 g/dL   HCT 44.0 40.0 - 52.0 %   MCV 90.8 80.0 - 100.0 fL   MCH 30.8 26.0 - 34.0 pg   MCHC 33.9 32.0 - 36.0 g/dL   RDW 12.8 11.5 - 14.5 %   Platelets 203 150 - 440 K/uL  Lipid  panel     Status: Abnormal   Collection Time: 07/17/15  5:13 AM  Result Value Ref Range   Cholesterol 196 0 - 200 mg/dL   Triglycerides 97 <150 mg/dL   HDL 40 (L) >40 mg/dL   Total CHOL/HDL Ratio 4.9 RATIO   VLDL 19 0 - 40 mg/dL   LDL Cholesterol 137 (H) 0 - 99 mg/dL    Comment:        Total Cholesterol/HDL:CHD Risk Coronary Heart Disease Risk Table                     Men   Women  1/2 Average Risk   3.4   3.3  Average Risk       5.0   4.4  2 X Average Risk   9.6   7.1  3 X Average Risk  23.4   11.0        Use the calculated Patient Ratio above and the CHD Risk Table to determine the patient's CHD Risk.        ATP III CLASSIFICATION (LDL):  <100     mg/dL   Optimal  100-129  mg/dL   Near or Above                    Optimal  130-159  mg/dL   Borderline  160-189  mg/dL   High  >190     mg/dL   Very High     Ct Head Wo Contrast  07/16/2015   CLINICAL DATA:  Left-sided numbness for 2 days  EXAM: CT HEAD WITHOUT CONTRAST  TECHNIQUE: Contiguous axial images were obtained from the base of the skull through the vertex without intravenous contrast.  COMPARISON:  None.  FINDINGS: Brain parenchyma and ventricular system are within normal limits. No mass effect, midline shift, or acute hemorrhage. Mastoid air cells are clear. Paranasal sinuses are clear. Adenoidal lymphoid tissue is moderately prominent.  IMPRESSION: No acute intracranial pathology.   Electronically Signed   By: Marybelle Killings M.D.   On: 07/16/2015 17:13   Mr Angiogram Neck W Wo Contrast  07/17/2015   CLINICAL DATA:  LEFT hemiparesis beginning 3 days ago.  EXAM: MRI HEAD WITHOUT AND WITH CONTRAST AND MRA HEAD WITHOUT CONTRAST AND MRI NECK WITHOUT AND WITH CONTRAST  TECHNIQUE: Multiplanar, multiecho pulse sequences of the brain and surrounding structures were obtained without and with intravenous contrast. Angiographic images of the head were obtained using MRA technique without and with contrast. Multiplanar, multiecho pulse  sequences of the neck and surrounding structures were obtained without and with intravenous contrast.  CONTRAST:  61m MULTIHANCE GADOBENATE DIMEGLUMINE 529 MG/ML IV SOLN  COMPARISON:  CT head 07/16/2015.  FINDINGS: MRI HEAD FINDINGS  Moderate-sized area of restricted diffusion affects the RIGHT paramedian pons consistent with acute brainstem small vessel infarction. No cerebellar, thalamic, or hemispheric involvement.  No acute or chronic hemorrhage, mass lesion, hydrocephalus, or extra-axial fluid.  Normal for age cerebral volume. No white matter disease. No midline abnormality. Extracranial soft tissues unremarkable. No sinus or mastoid disease.  MRA HEAD FINDINGS  The internal carotid arteries are widely patent. The basilar artery is widely patent with LEFT vertebral as the dominant/ sole contributor. RIGHT vertebral appears to supply predominantly PICA with only a rudimentary distal V4 segment. Mild irregularity of the RIGHT A1 ACA is non worrisome, given the robust LEFT A1 ACA and supply to the distal anterior cerebral arteries. No RIGHT or LEFT M1 or M2 stenosis. Both posterior cerebral arteries widely patent. No visible cerebellar branch occlusion. Within limits of detection on MR, no intracranial aneurysm.  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 No proximal great vessel stenosis. Aberrant RIGHT subclavian.  Carotid bifurcations are free of disease. The internal carotid arteries are widely patent. There is no dissection or fibromuscular dysplasia in the cervical ICA segments.  LEFT vertebral is dominant but the RIGHT vertebral is patent. No ostial disease. The RIGHT vertebral is hypoplastic distal to the RIGHT PICA origin. There is no evidence for vertebral dissection.  IMPRESSION: Moderate-sized area of restricted diffusion affecting the RIGHT paramedian pons consistent with an acute, nonhemorrhagic, brainstem stroke. No cerebellar, thalamic, or hemispheric  involvement.  No extracranial or intracranial flow reducing lesion of significance. The basilar artery in the region of the acute infarction demonstrates no evidence for dissection, stenosis, or irregularity.  These results will be called to the ordering clinician or representative by the Radiologist Assistant, and communication documented in the PACS or zVision Dashboard.   Electronically Signed   By: JStaci RighterM.D.   On: 07/17/2015 10:50   Mr Brain Wo Contrast  07/17/2015   CLINICAL DATA:  LEFT hemiparesis beginning 3 days ago.  EXAM: MRI HEAD WITHOUT AND WITH CONTRAST AND MRA HEAD WITHOUT CONTRAST AND MRI NECK WITHOUT AND WITH CONTRAST  TECHNIQUE: Multiplanar, multiecho pulse sequences of the brain and surrounding structures were obtained without and with intravenous contrast. Angiographic images of the head were obtained using MRA technique without and with contrast. Multiplanar, multiecho pulse sequences of the neck and surrounding structures were obtained without and with intravenous contrast.  CONTRAST:  257mMULTIHANCE GADOBENATE DIMEGLUMINE 529 MG/ML IV SOLN  COMPARISON:  CT  head 07/16/2015.  FINDINGS: MRI HEAD FINDINGS  Moderate-sized area of restricted diffusion affects the RIGHT paramedian pons consistent with acute brainstem small vessel infarction. No cerebellar, thalamic, or hemispheric involvement.  No acute or chronic hemorrhage, mass lesion, hydrocephalus, or extra-axial fluid.  Normal for age cerebral volume. No white matter disease. No midline abnormality. Extracranial soft tissues unremarkable. No sinus or mastoid disease.  MRA HEAD FINDINGS  The internal carotid arteries are widely patent. The basilar artery is widely patent with LEFT vertebral as the dominant/ sole contributor. RIGHT vertebral appears to supply predominantly PICA with only a rudimentary distal V4 segment. Mild irregularity of the RIGHT A1 ACA is non worrisome, given the robust LEFT A1 ACA and supply to the distal  anterior cerebral arteries. No RIGHT or LEFT M1 or M2 stenosis. Both posterior cerebral arteries widely patent. No visible cerebellar branch occlusion. Within limits of detection on MR, no intracranial aneurysm.  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 No proximal great vessel stenosis. Aberrant RIGHT subclavian.  Carotid bifurcations are free of disease. The internal carotid arteries are widely patent. There is no dissection or fibromuscular dysplasia in the cervical ICA segments.  LEFT vertebral is dominant but the RIGHT vertebral is patent. No ostial disease. The RIGHT vertebral is hypoplastic distal to the RIGHT PICA origin. There is no evidence for vertebral dissection.  IMPRESSION: Moderate-sized area of restricted diffusion affecting the RIGHT paramedian pons consistent with an acute, nonhemorrhagic, brainstem stroke. No cerebellar, thalamic, or hemispheric involvement.  No extracranial or intracranial flow reducing lesion of significance. The basilar artery in the region of the acute infarction demonstrates no evidence for dissection, stenosis, or irregularity.  These results will be called to the ordering clinician or representative by the Radiologist Assistant, and communication documented in the PACS or zVision Dashboard.   Electronically Signed   By: Staci Righter M.D.   On: 07/17/2015 10:50   Mr Lovenia Kim  07/17/2015   CLINICAL DATA:  LEFT hemiparesis beginning 3 days ago.  EXAM: MRI HEAD WITHOUT AND WITH CONTRAST AND MRA HEAD WITHOUT CONTRAST AND MRI NECK WITHOUT AND WITH CONTRAST  TECHNIQUE: Multiplanar, multiecho pulse sequences of the brain and surrounding structures were obtained without and with intravenous contrast. Angiographic images of the head were obtained using MRA technique without and with contrast. Multiplanar, multiecho pulse sequences of the neck and surrounding structures were obtained without and with intravenous contrast.  CONTRAST:   53m MULTIHANCE GADOBENATE DIMEGLUMINE 529 MG/ML IV SOLN  COMPARISON:  CT head 07/16/2015.  FINDINGS: MRI HEAD FINDINGS  Moderate-sized area of restricted diffusion affects the RIGHT paramedian pons consistent with acute brainstem small vessel infarction. No cerebellar, thalamic, or hemispheric involvement.  No acute or chronic hemorrhage, mass lesion, hydrocephalus, or extra-axial fluid.  Normal for age cerebral volume. No white matter disease. No midline abnormality. Extracranial soft tissues unremarkable. No sinus or mastoid disease.  MRA HEAD FINDINGS  The internal carotid arteries are widely patent. The basilar artery is widely patent with LEFT vertebral as the dominant/ sole contributor. RIGHT vertebral appears to supply predominantly PICA with only a rudimentary distal V4 segment. Mild irregularity of the RIGHT A1 ACA is non worrisome, given the robust LEFT A1 ACA and supply to the distal anterior cerebral arteries. No RIGHT or LEFT M1 or M2 stenosis. Both posterior cerebral arteries widely patent. No visible cerebellar branch occlusion. Within limits of detection on MR, no intracranial aneurysm.  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 No proximal great vessel stenosis. Aberrant RIGHT subclavian.  Carotid bifurcations are free of disease. The internal carotid arteries are widely patent. There is no dissection or fibromuscular dysplasia in the cervical ICA segments.  LEFT vertebral is dominant but the RIGHT vertebral is patent. No ostial disease. The RIGHT vertebral is hypoplastic distal to the RIGHT PICA origin. There is no evidence for vertebral dissection.  IMPRESSION: Moderate-sized area of restricted diffusion affecting the RIGHT paramedian pons consistent with an acute, nonhemorrhagic, brainstem stroke. No cerebellar, thalamic, or hemispheric involvement.  No extracranial or intracranial flow reducing lesion of significance. The basilar artery in the  region of the acute infarction demonstrates no evidence for dissection, stenosis, or irregularity.  These results will be called to the ordering clinician or representative by the Radiologist Assistant, and communication documented in the PACS or zVision Dashboard.   Electronically Signed   By: Staci Righter M.D.   On: 07/17/2015 10:50    Review of Systems  Constitutional: Negative.   HENT: Negative.   Eyes: Negative.   Respiratory: Negative.   Cardiovascular: Negative.   Gastrointestinal: Negative.   Genitourinary: Negative.   Musculoskeletal: Negative.   Skin: Negative.   Neurological: Positive for focal weakness. Negative for dizziness, tingling, tremors, sensory change, speech change, seizures and loss of consciousness.  Psychiatric/Behavioral: Negative.    Blood pressure 168/96, pulse 59, temperature 98.4 F (36.9 C), temperature source Oral, resp. rate 18, height _0  (1.956 m), weight 118.616 kg (261 lb 8 oz), SpO2 100 %. Physical Exam  Nursing note and vitals reviewed. Constitutional: He appears well-developed and well-nourished. No distress.  HENT:  Head: Normocephalic and atraumatic.  Right Ear: External ear normal.  Left Ear: External ear normal.  Nose: Nose normal.  Mouth/Throat: Oropharynx is clear and moist.  Eyes: Conjunctivae and EOM are normal. Pupils are equal, round, and reactive to light. No scleral icterus.  Neck: Normal range of motion. Neck supple.  Cardiovascular: Normal rate, regular rhythm, normal heart sounds and intact distal pulses.   Respiratory: Effort normal and breath sounds normal. No respiratory distress.  GI: Soft. Bowel sounds are normal. He exhibits no distension.  Musculoskeletal: Normal range of motion. He exhibits no edema.  Neurological:  A+Ox3, nl speech and language PERRLA, EOMI, nl VF, face symmetric, tongue midline 5/5 R, 4/5 L Dysmetria out of proportion to weakness on L 2+/4 B, babinski on L, plantar down on R Intact temp and  vibration, no neglect  Skin: He is not diaphoretic.   MRI of brain personally reviewed by me and shows R paramedian pons infarct  Assessment/Plan: 1.  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  HTN-  Uncontrolled -  Start ASA 25m daily -  Start Zocor 264mqHS -  Ok to start Norvasc 1053mO daily to get BP goal < 130/80 -  Pt needs to stop smoking -  Recommend rehab if possible so pt can become more functional -  Ok to d/c, call with questions -  [facility]  Avaleigh Decuir 07/17/2015, 1:35 PM

## 2015-07-17 NOTE — Plan of Care (Signed)
Problem: Discharge/Transitional Outcomes Goal: Other Discharge Outcomes/Goals Outcome: Progressing Plan of care progress to goal: Pts BP has been running high.  REceived hydralazine PRN . NIH score -2 r/t to left arm and left leg drift Pt up ad lib Good appetite

## 2015-07-17 NOTE — Progress Notes (Signed)
PT Cancellation Note  Patient Details Name: Davidmichael Zarazua MRN: [id number] DOB: 1991/03/13   Cancelled Treatment:    Reason Eval/Treat Not Completed: Patient at procedure or test/unavailable. Patient is off the floor for testing, PT will re-attempt when patient is available and able to participate in mobility evaluation.   Kerin Ransom, PT, DPT    07/17/2015, 9:42 AM

## 2015-07-17 NOTE — Plan of Care (Signed)
Problem: Discharge/Transitional Outcomes Goal: Barriers To Progression Addressed/Resolved Individualization:  Outcome: Progressing Pt likes to be called Tony Taylor Pts mom had a stroke and is completely dependent. Fathers health has suffered, but he is doing well now.

## 2015-07-17 NOTE — Progress Notes (Signed)
PO BP meds given for HTN. Hydralazine IV given once with no improvement. Last BP 172/90, MD aware and verbalized it is OK to discharge pt. Rest of VSS. NIH score 0. No neuro changes during the shift. Pt denies pain. Steady gait, ambulates independently. Tolerates diet. Discharge instructions given and explained to pt. Educational handouts about ischemic stroke, stroke prevention and smoking cessation given and explained to pt. Pt verbalized understanding.

## 2015-07-17 NOTE — Progress Notes (Signed)
Speech Therapy Note: received order, consulted NSG and reviewed chart notes. Met w/ pt who denied any trouble w/ his swallowing during his meals since admitted; NSG denied any trouble of pt swallowing pills w/ water. Pt stated his speech was clear, just felt min. "effortful" d/t the motor movements - similar to his U/LEs. Pt was able to converse at conversational level w/ no dysarthria noted during conversation w/ SLP and CM. No skilled ST services indicated at this time. ST will sign off and can be reconsulted if any change in status. NSG agreed.

## 2015-07-17 NOTE — Evaluation (Signed)
Physical Therapy Evaluation Patient Details Name: Tony Taylor MRN: [id number] DOB: Feb 13, 1991 Today's Date: 07/17/2015   History of Present Illness  Patient is a 24 y/o male that presents with acute, nonhemorragic, brainstem stroke. He initially felt weakness on his left side and slurred speech, though these appear to be resolved or near resolved.   Clinical Impression  Patient self admitted to [facility] after noticing weakness in his LUE and slurred speech. Found to have acute non-hemorragic, brainstem stroke on MRI. Patient displays no obvious strength deficits, however he displays subtle motor planning and ataxic use of LUE missing his nose on one occasion with finger nose finger and slowly performing pad to pad finger touching on LUE compared to R. Patient at this time shows no remarkable mobility deficits, heel slides on shins were symmetrical. It appears his deficits now are more OT related and speech related. No PT follow up recommended at this time.     Follow Up Recommendations No PT follow up    Equipment Recommendations       Recommendations for Other Services       Precautions / Restrictions Precautions Precautions: None Restrictions Weight Bearing Restrictions: No      Mobility  Bed Mobility Overal bed mobility: Independent             General bed mobility comments: No deficits noted.   Transfers Overall transfer level: Independent               General transfer comment: Patient has been using the bathroom independently.   Ambulation/Gait Ambulation/Gait assistance: Independent Ambulation Distance (Feet): 300 Feet   Gait Pattern/deviations: WFL(Within Functional Limits)   Gait velocity interpretation: at or above normal speed for age/gender General Gait Details: No deficits noted.   Stairs Stairs: Yes Stairs assistance: Independent Stair Management: One rail Left Number of Stairs: 15 General stair comments: No deficits noted ascending or  descending stairs.   Wheelchair Mobility    Modified Rankin (Stroke Patients Only)       Balance Overall balance assessment: Independent                                           Pertinent Vitals/Pain Pain Assessment: No/denies pain    Home Living Family/patient expects to be discharged to:: Private residence Living Arrangements: Parent Available Help at Discharge: Family Type of Home: House Home Access: Stairs to enter   Secretary/administrator of Steps: 5   Home Equipment: None      Prior Function Level of Independence: Independent               Hand Dominance   Dominant Hand: Right    Extremity/Trunk Assessment   Upper Extremity Assessment: Overall WFL for tasks assessed (Patient reports motor planning deficits in LUE, no strength deficits. Finger nose finger revealed subtle ataxia and motor planning deficits with LUE)           Lower Extremity Assessment: Overall WFL for tasks assessed         Communication   Communication: No difficulties (Very mild slurred speech)  Cognition Arousal/Alertness: Awake/alert Behavior During Therapy: WFL for tasks assessed/performed Overall Cognitive Status: Within Functional Limits for tasks assessed                      General Comments      Exercises  Assessment/Plan    PT Assessment Patent does not need any further PT services  PT Diagnosis     PT Problem List    PT Treatment Interventions     PT Goals (Current goals can be found in the Care Plan section) Acute Rehab PT Goals Patient Stated Goal: To return home safely  PT Goal Formulation: With patient Time For Goal Achievement: 07/31/15 Potential to Achieve Goals: Good    Frequency     Barriers to discharge        Co-evaluation               End of Session Equipment Utilized During Treatment: Gait belt Activity Tolerance: Patient tolerated treatment well Patient left:  (Patient in  restroom) Nurse Communication: Mobility status         Time: 8119-1478 PT Time Calculation (min) (ACUTE ONLY): 12 min   Charges:   PT Evaluation $Initial PT Evaluation Tier I: 1 Procedure     PT G Codes:       Kerin Ransom, PT, DPT    07/17/2015, 1:16 PM

## 2015-07-17 NOTE — Discharge Summary (Signed)
[facility] - [place] [facility]   PATIENT NAME: Tony Taylor    MR#:  [id number]  DATE OF BIRTH:  Dec 10, 1990  DATE OF ADMISSION:  07/16/2015 ADMITTING PHYSICIAN: Oralia Manis, MD  DATE OF DISCHARGE:10/4/ 2016 PRIMARY CARE PHYSICIAN: No PCP Per Patient    ADMISSION DIAGNOSIS:  Left-sided weakness [M62.89] Hypertensive emergency [I16.1]  DISCHARGE DIAGNOSIS:  Principal Problem:   Left hemiparesis (HCC) Active Problems:   Accelerated hypertension   SECONDARY DIAGNOSIS:   Past Medical History  Diagnosis Date  . Hypertension   . CVA (cerebral infarction)     HOSPITAL COURSE:  24 year-old man presents with right sided weakness and accelerated HTN. For further details please refer the H&P.   1. 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 Patient will need adequate blood pressure control. Continue aspirin and statin.  2. Accelerated hypertension: Norvasc has been started. Patient will need close follow-up with PCP.  3. Hyperlipidemia: LDL is 137. Goal is less than 100 in patient with stroke. Statin was started. Side effects, alternatives and wrists were discussed.  4. Tobacco dependence: Patient is encouraged to assess smoking as this is #1 risk factor for stroke and coronary disease. Tony Taylor was counseled 3 minutes.  DISCHARGE CONDITIONS AND DIET:  Home stable condition on heart healthy diet  CONSULTS OBTAINED:  Treatment Team:  Mellody Drown, MD  DRUG ALLERGIES:  No Known Allergies  DISCHARGE MEDICATIONS:  norvasc 10 mg daily ASA 81 mg daily LIPITOR 40 mg daily        Today   CHIEF COMPLAINT:  Doing well. Right-sided weakness is resolving. Symptoms started about 3 days ago. Denies headache or chest pain   VITAL SIGNS:  Blood pressure 160/100, pulse 52, temperature 98.4 F (36.9 C), temperature source Oral, resp. rate 18, height  (1.956 m),  weight 118.616 kg (261 lb 8 oz), SpO2 100 %.   REVIEW OF SYSTEMS:  Review of Systems  Constitutional: Negative for fever, chills and malaise/fatigue.  HENT: Negative for sore throat.   Eyes: Negative for blurred vision.  Respiratory: Negative for cough, hemoptysis, shortness of breath and wheezing.   Cardiovascular: Negative for chest pain, palpitations and leg swelling.  Gastrointestinal: Negative for nausea, vomiting, abdominal pain, diarrhea and blood in stool.  Genitourinary: Negative for dysuria.  Musculoskeletal: Negative for back pain.  Neurological: Positive for focal weakness (improving). Negative for dizziness, tremors and headaches.  Endo/Heme/Allergies: Does not bruise/bleed easily.     PHYSICAL EXAMINATION:  GENERAL:  24 y.o.-year-old patient lying in the bed with no acute distress.  NECK:  Supple, no jugular venous distention. No thyroid enlargement, no tenderness.  LUNGS: Normal breath sounds bilaterally, no wheezing, rales,rhonchi  No use of accessory muscles of respiration.  CARDIOVASCULAR: S1, S2 normal. No murmurs, rubs, or gallops.  ABDOMEN: Soft, non-tender, non-distended. Bowel sounds present. No organomegaly or mass.  EXTREMITIES: No pedal edema, cyanosis, or clubbing.  PSYCHIATRIC: The patient is alert and oriented x 3.  SKIN: No obvious rash, lesion, or ulcer.   NEURO CN 2-12 intact RUE 4/5 strength DATA REVIEW:   CBC  Recent Labs Lab 07/17/15 0513  WBC 7.2  HGB 14.9  HCT 44.0  PLT 203    Chemistries   Recent Labs Lab 07/16/15 1706  NA 136  K 3.7  CL 102  CO2 26  GLUCOSE 94  BUN 12  CREATININE 1.25*  CALCIUM 9.2  AST 19  ALT 23  ALKPHOS 91  BILITOT 0.7    Cardiac Enzymes  Recent Labs Lab 07/16/15 1706 07/17/15 0010 07/17/15 0513  TROPONINI <0.03 <0.03 <0.03    Microbiology Results  @[id number] @  RADIOLOGY:  Ct Head Wo Contrast  07/16/2015   CLINICAL DATA:  Left-sided numbness for 2 days  EXAM: CT HEAD WITHOUT  CONTRAST  TECHNIQUE: Contiguous axial images were obtained from the base of the skull through the vertex without intravenous contrast.  COMPARISON:  None.  FINDINGS: Brain parenchyma and ventricular system are within normal limits. No mass effect, midline shift, or acute hemorrhage. Mastoid air cells are clear. Paranasal sinuses are clear. Adenoidal lymphoid tissue is moderately prominent.  IMPRESSION: No acute intracranial pathology.   Electronically Signed   By: Jolaine Click M.D.   On: 07/16/2015 17:13   Mr Angiogram Neck W Wo Contrast  07/17/2015   CLINICAL DATA:  LEFT hemiparesis beginning 3 days ago.  EXAM: MRI HEAD WITHOUT AND WITH CONTRAST AND MRA HEAD WITHOUT CONTRAST AND MRI NECK WITHOUT AND WITH CONTRAST  TECHNIQUE: Multiplanar, multiecho pulse sequences of the brain and surrounding structures were obtained without and with intravenous contrast. Angiographic images of the head were obtained using MRA technique without and with contrast. Multiplanar, multiecho pulse sequences of the neck and surrounding structures were obtained without and with intravenous contrast.  CONTRAST:  20mL MULTIHANCE GADOBENATE DIMEGLUMINE 529 MG/ML IV SOLN  COMPARISON:  CT head 07/16/2015.  FINDINGS: MRI HEAD FINDINGS  Moderate-sized area of restricted diffusion affects the RIGHT paramedian pons consistent with acute brainstem small vessel infarction. No cerebellar, thalamic, or hemispheric involvement.  No acute or chronic hemorrhage, mass lesion, hydrocephalus, or extra-axial fluid.  Normal for age cerebral volume. No white matter disease. No midline abnormality. Extracranial soft tissues unremarkable. No sinus or mastoid disease.  MRA HEAD FINDINGS  The internal carotid arteries are widely patent. The basilar artery is widely patent with LEFT vertebral as the dominant/ sole contributor. RIGHT vertebral appears to supply predominantly PICA with only a rudimentary distal V4 segment. Mild irregularity of the RIGHT A1 ACA is  non worrisome, given the robust LEFT A1 ACA and supply to the distal anterior cerebral arteries. No RIGHT or LEFT M1 or M2 stenosis. Both posterior cerebral arteries widely patent. No visible cerebellar branch occlusion. Within limits of detection on MR, no intracranial aneurysm.  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 No proximal great vessel stenosis. Aberrant RIGHT subclavian.  Carotid bifurcations are free of disease. The internal carotid arteries are widely patent. There is no dissection or fibromuscular dysplasia in the cervical ICA segments.  LEFT vertebral is dominant but the RIGHT vertebral is patent. No ostial disease. The RIGHT vertebral is hypoplastic distal to the RIGHT PICA origin. There is no evidence for vertebral dissection.  IMPRESSION: Moderate-sized area of restricted diffusion affecting the RIGHT paramedian pons consistent with an acute, nonhemorrhagic, brainstem stroke. No cerebellar, thalamic, or hemispheric involvement.  No extracranial or intracranial flow reducing lesion of significance. The basilar artery in the region of the acute infarction demonstrates no evidence for dissection, stenosis, or irregularity.  These results will be called to the ordering clinician or representative by the Radiologist Assistant, and communication documented in the PACS or zVision Dashboard.   Electronically Signed   By: Elsie Stain M.D.   On: 07/17/2015 10:50   Mr Brain Wo Contrast  07/17/2015   CLINICAL DATA:  LEFT hemiparesis beginning 3 days ago.  EXAM: MRI HEAD  WITHOUT AND WITH CONTRAST AND MRA HEAD WITHOUT CONTRAST AND MRI NECK WITHOUT AND WITH CONTRAST  TECHNIQUE: Multiplanar, multiecho pulse sequences of the brain and surrounding structures were obtained without and with intravenous contrast. Angiographic images of the head were obtained using MRA technique without and with contrast. Multiplanar, multiecho pulse sequences of the neck and  surrounding structures were obtained without and with intravenous contrast.  CONTRAST:  20mL MULTIHANCE GADOBENATE DIMEGLUMINE 529 MG/ML IV SOLN  COMPARISON:  CT head 07/16/2015.  FINDINGS: MRI HEAD FINDINGS  Moderate-sized area of restricted diffusion affects the RIGHT paramedian pons consistent with acute brainstem small vessel infarction. No cerebellar, thalamic, or hemispheric involvement.  No acute or chronic hemorrhage, mass lesion, hydrocephalus, or extra-axial fluid.  Normal for age cerebral volume. No white matter disease. No midline abnormality. Extracranial soft tissues unremarkable. No sinus or mastoid disease.  MRA HEAD FINDINGS  The internal carotid arteries are widely patent. The basilar artery is widely patent with LEFT vertebral as the dominant/ sole contributor. RIGHT vertebral appears to supply predominantly PICA with only a rudimentary distal V4 segment. Mild irregularity of the RIGHT A1 ACA is non worrisome, given the robust LEFT A1 ACA and supply to the distal anterior cerebral arteries. No RIGHT or LEFT M1 or M2 stenosis. Both posterior cerebral arteries widely patent. No visible cerebellar branch occlusion. Within limits of detection on MR, no intracranial aneurysm.  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 No proximal great vessel stenosis. Aberrant RIGHT subclavian.  Carotid bifurcations are free of disease. The internal carotid arteries are widely patent. There is no dissection or fibromuscular dysplasia in the cervical ICA segments.  LEFT vertebral is dominant but the RIGHT vertebral is patent. No ostial disease. The RIGHT vertebral is hypoplastic distal to the RIGHT PICA origin. There is no evidence for vertebral dissection.  IMPRESSION: Moderate-sized area of restricted diffusion affecting the RIGHT paramedian pons consistent with an acute, nonhemorrhagic, brainstem stroke. No cerebellar, thalamic, or hemispheric involvement.  No  extracranial or intracranial flow reducing lesion of significance. The basilar artery in the region of the acute infarction demonstrates no evidence for dissection, stenosis, or irregularity.  These results will be called to the ordering clinician or representative by the Radiologist Assistant, and communication documented in the PACS or zVision Dashboard.   Electronically Signed   By: Elsie Stain M.D.   On: 07/17/2015 10:50   Mr Palma Holter  07/17/2015   CLINICAL DATA:  LEFT hemiparesis beginning 3 days ago.  EXAM: MRI HEAD WITHOUT AND WITH CONTRAST AND MRA HEAD WITHOUT CONTRAST AND MRI NECK WITHOUT AND WITH CONTRAST  TECHNIQUE: Multiplanar, multiecho pulse sequences of the brain and surrounding structures were obtained without and with intravenous contrast. Angiographic images of the head were obtained using MRA technique without and with contrast. Multiplanar, multiecho pulse sequences of the neck and surrounding structures were obtained without and with intravenous contrast.  CONTRAST:  20mL MULTIHANCE GADOBENATE DIMEGLUMINE 529 MG/ML IV SOLN  COMPARISON:  CT head 07/16/2015.  FINDINGS: MRI HEAD FINDINGS  Moderate-sized area of restricted diffusion affects the RIGHT paramedian pons consistent with acute brainstem small vessel infarction. No cerebellar, thalamic, or hemispheric involvement.  No acute or chronic hemorrhage, mass lesion, hydrocephalus, or extra-axial fluid.  Normal for age cerebral volume. No white matter disease. No midline abnormality. Extracranial soft tissues unremarkable. No sinus or mastoid disease.  MRA HEAD FINDINGS  The internal carotid arteries are widely patent. The basilar artery is  widely patent with LEFT vertebral as the dominant/ sole contributor. RIGHT vertebral appears to supply predominantly PICA with only a rudimentary distal V4 segment. Mild irregularity of the RIGHT A1 ACA is non worrisome, given the robust LEFT A1 ACA and supply to the distal anterior cerebral arteries. No  RIGHT or LEFT M1 or M2 stenosis. Both posterior cerebral arteries widely patent. No visible cerebellar branch occlusion. Within limits of detection on MR, no intracranial aneurysm.  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 No proximal great vessel stenosis. Aberrant RIGHT subclavian.  Carotid bifurcations are free of disease. The internal carotid arteries are widely patent. There is no dissection or fibromuscular dysplasia in the cervical ICA segments.  LEFT vertebral is dominant but the RIGHT vertebral is patent. No ostial disease. The RIGHT vertebral is hypoplastic distal to the RIGHT PICA origin. There is no evidence for vertebral dissection.  IMPRESSION: Moderate-sized area of restricted diffusion affecting the RIGHT paramedian pons consistent with an acute, nonhemorrhagic, brainstem stroke. No cerebellar, thalamic, or hemispheric involvement.  No extracranial or intracranial flow reducing lesion of significance. The basilar artery in the region of the acute infarction demonstrates no evidence for dissection, stenosis, or irregularity.  These results will be called to the ordering clinician or representative by the Radiologist Assistant, and communication documented in the PACS or zVision Dashboard.   Electronically Signed   By: Elsie Stain M.D.   On: 07/17/2015 10:50      Management plans discussed with the patient and Tony Taylor is in agreement. Stable for discharge home  Patient should follow up with PCP in 1 week [facility] in 3 months CODE STATUS:     Code Status Orders        Start     Ordered   07/16/15 2346  Full code   Continuous     07/16/15 2345      TOTAL TIME TAKING CARE OF THIS PATIENT: 30 minutes.    Evalyse Stroope M.D on 07/17/2015 at 11:14 AM  Between 7am to 6pm - Pager - [phone] After 6pm go to [email or web address] - password [email or web address] [facility]  [place] [facility]  Office  [phone]  CC: Primary care physician; No PCP  Per Patient

## 2015-07-17 NOTE — Care Management (Addendum)
Admitted to this facility with the diagnosis of left hemiparesis. Lives with his father, Alfred [phone]). Last worked 2 months ago at [employer]. No insurance. Takes care of all basic activities of daily living himself, drives. Lives in [place]. Never been to the [facility]. Application to [facility]. Will update Leamon Arnt. Information on [facility] and Medication Management given to Mr. Christell Constant. Possible discharge later today.  [name] RN MSN Care Management [phone]

## 2015-07-17 NOTE — Progress Notes (Signed)
*  PRELIMINARY RESULTS* Echocardiogram 2D Echocardiogram has been performed.  Georgann Housekeeper Hege 07/17/2015, 10:49 AM

## 2015-09-08 ENCOUNTER — Emergency Department
Admission: EM | Admit: 2015-09-08 | Discharge: 2015-09-08 | Disposition: A | Discharge status: Home / Self Care | Payer: Self-pay | Attending: Emergency Medicine | Admitting: Emergency Medicine

## 2015-09-08 ENCOUNTER — Emergency Department: Payer: Self-pay

## 2015-09-08 ENCOUNTER — Encounter: Payer: Self-pay | Admitting: Medical Oncology

## 2015-09-08 DIAGNOSIS — Z79899 Other long term (current) drug therapy: Secondary | ICD-10-CM | POA: Insufficient documentation

## 2015-09-08 DIAGNOSIS — Y9367 Activity, basketball: Secondary | ICD-10-CM | POA: Insufficient documentation

## 2015-09-08 DIAGNOSIS — Y998 Other external cause status: Secondary | ICD-10-CM | POA: Insufficient documentation

## 2015-09-08 DIAGNOSIS — X509XXA Other and unspecified overexertion or strenuous movements or postures, initial encounter: Secondary | ICD-10-CM | POA: Insufficient documentation

## 2015-09-08 DIAGNOSIS — I10 Essential (primary) hypertension: Secondary | ICD-10-CM | POA: Insufficient documentation

## 2015-09-08 DIAGNOSIS — Z7982 Long term (current) use of aspirin: Secondary | ICD-10-CM | POA: Insufficient documentation

## 2015-09-08 DIAGNOSIS — Y92310 Basketball court as the place of occurrence of the external cause: Secondary | ICD-10-CM | POA: Insufficient documentation

## 2015-09-08 DIAGNOSIS — F17210 Nicotine dependence, cigarettes, uncomplicated: Secondary | ICD-10-CM | POA: Insufficient documentation

## 2015-09-08 DIAGNOSIS — S93401A Sprain of unspecified ligament of right ankle, initial encounter: Secondary | ICD-10-CM | POA: Insufficient documentation

## 2015-09-08 MED ORDER — NAPROXEN 500 MG PO TBEC: 500.0000 mg | DELAYED_RELEASE_TABLET | Freq: Two times a day (BID) | ORAL | Status: DC

## 2015-09-08 NOTE — ED Provider Notes (Signed)
[facility] Emergency Department Provider Note ____________________________________________  Time seen: 0909  I have reviewed the triage vital signs and the nursing notes.  HISTORY  Chief Complaint  Ankle Pain  HPI Tony Taylor is a 24 y.o. male reports to the ED for evaluation of an injury to his right ankle while playing basketball yesterday. He reports today that he rolled his ankle while playing basketball. He denies any other injury at this time. He reports ankle swelling and tenderness.  Past Medical History  Diagnosis Date  . Hypertension   . CVA (cerebral infarction)     Patient Active Problem List   Diagnosis Date Noted  . Left hemiparesis (HCC) 07/16/2015  . Accelerated hypertension 07/16/2015    Past Surgical History  Procedure Laterality Date  . No past surgeries      Current Outpatient Rx  Name  Route  Sig  Dispense  Refill  . metoprolol (LOPRESSOR) 50 MG tablet   Oral   Take 50 mg by mouth 2 (two) times daily.         Marland Kitchen. amLODipine (NORVASC) 5 MG tablet   Oral   Take 2 tablets (10 mg total) by mouth daily.   10 tablet   0   . aspirin (ASPIRIN CHILDRENS) 81 MG chewable tablet   Oral   Chew 1 tablet (81 mg total) by mouth daily.   120 tablet   0   . atorvastatin (LIPITOR) 10 MG tablet   Oral   Take 4 tablets (40 mg total) by mouth daily.   30 tablet   0   . naproxen (EC NAPROSYN) 500 MG EC tablet   Oral   Take 1 tablet (500 mg total) by mouth 2 (two) times daily with a meal.   30 tablet   0   . nicotine (NICODERM CQ) 14 mg/24hr patch   Transdermal   Place 1 patch (14 mg total) onto the skin daily.   28 patch   0    Allergies Review of patient's allergies indicates no known allergies.  Family History  Problem Relation Age of Onset  . Stroke Mother    Social History Social History  Substance Use Topics  . Smoking status: Current Every Day Smoker    Types: Cigarettes  . Smokeless tobacco: Never Used   . Alcohol Use: No   Review of Systems  Constitutional: Negative for fever. Eyes: Negative for visual changes. ENT: Negative for sore throat. Cardiovascular: Negative for chest pain. Respiratory: Negative for shortness of breath. Gastrointestinal: Negative for abdominal pain, vomiting and diarrhea. Genitourinary: Negative for dysuria. Musculoskeletal: Negative for back pain. Right ankle pain as above. Skin: Negative for rash. Neurological: Negative for headaches, focal weakness or numbness. ____________________________________________  PHYSICAL EXAM:  VITAL SIGNS: ED Triage Vitals  Enc Vitals Group     BP 09/08/15 0751 177/107 mmHg     Pulse Rate 09/08/15 0751 79     Resp 09/08/15 0751 17     Temp 09/08/15 0751 98 F (36.7 C)     Temp Source 09/08/15 0751 Oral     SpO2 09/08/15 0751 98 %     Weight 09/08/15 0751 250 lb (113.399 kg)     Height 09/08/15 0751 6\' 5"  (1.956 m)     Head Cir --      Peak Flow --      Pain Score --      Pain Loc --      Pain Edu? --  Excl. in GC? --    Constitutional: Alert and oriented. Well appearing and in no distress. Head: Normocephalic and atraumatic.      Eyes: Conjunctivae are normal. PERRL. Normal extraocular movements      Ears: Canals clear. TMs intact bilaterally.   Nose: No congestion/rhinorrhea.   Mouth/Throat: Mucous membranes are moist.   Neck: Supple. No thyromegaly. Hematological/Lymphatic/Immunological: No cervical lymphadenopathy. Cardiovascular: Normal rate, regular rhythm. Normal distal pulses. Respiratory: Normal respiratory effort. No wheezes/rales/rhonchi. Gastrointestinal: Soft and nontender. No distention. Musculoskeletal: Right ankle with obvious lateral swelling at the malleolus. Normal ankle range of motion is appreciated patient is tender to palpation along the 3 ligaments of the fibula. Negative drawers appreciated. or Achilles tenderness is noted. Nontender with normal range of motion in all  extremities.  Neurologic:  Normal gait without ataxia. Normal speech and language. No gross focal neurologic deficits are appreciated. Skin:  Skin is warm, dry and intact. No rash noted. Psychiatric: Mood and affect are normal. Patient exhibits appropriate insight and judgment. ____________________________________________   RADIOLOGY  Right Ankle IMPRESSION: No acute fracture.  I, Khylin Gutridge, Charlesetta Ivory, personally viewed and evaluated these images (plain radiographs) as part of my medical decision making.  ___________________________________________  PROCEDURES  Ankle Stirrup Splint Crutches ____________________________________________  INITIAL IMPRESSION / ASSESSMENT AND PLAN / ED COURSE  Patient with a grade II right ankle sprain. No radiographic evidence of fracture or dislocation. Ankle splint placed and rice instructions are provided. Prescription for Naprosyn provided. Patient is to follow with the primary care provider or Dr. Joice Lofts as needed for ongoing symptoms. ____________________________________________  FINAL CLINICAL IMPRESSION(S) / ED DIAGNOSES  Final diagnoses:  Ankle sprain, right, initial encounter      Lissa Hoard, PA-C 09/08/15 8657  Phineas Semen, MD 09/08/15 1041

## 2015-09-08 NOTE — Discharge Instructions (Signed)
Ankle Sprain °An ankle sprain is an injury to the strong, fibrous tissues (ligaments) that hold the bones of your ankle joint together.  °CAUSES °An ankle sprain is usually caused by a fall or by twisting your ankle. Ankle sprains most commonly occur when you step on the outer edge of your foot, and your ankle turns inward. People who participate in sports are more prone to these types of injuries.  °SYMPTOMS  °· Pain in your ankle. The pain may be present at rest or only when you are trying to stand or walk. °· Swelling. °· Bruising. Bruising may develop immediately or within 1 to 2 days after your injury. °· Difficulty standing or walking, particularly when turning corners or changing directions. °DIAGNOSIS  °Your caregiver will ask you details about your injury and perform a physical exam of your ankle to determine if you have an ankle sprain. During the physical exam, your caregiver will press on and apply pressure to specific areas of your foot and ankle. Your caregiver will try to move your ankle in certain ways. An X-ray exam may be done to be sure a bone was not broken or a ligament did not separate from one of the bones in your ankle (avulsion fracture).  °TREATMENT  °Certain types of braces can help stabilize your ankle. Your caregiver can make a recommendation for this. Your caregiver may recommend the use of medicine for pain. If your sprain is severe, your caregiver may refer you to a surgeon who helps to restore function to parts of your skeletal system (orthopedist) or a physical therapist. °HOME CARE INSTRUCTIONS  °· Apply ice to your injury for 1-2 days or as directed by your caregiver. Applying ice helps to reduce inflammation and pain. °¨ Put ice in a plastic bag. °¨ Place a towel between your skin and the bag. °¨ Leave the ice on for 15-20 minutes at a time, every 2 hours while you are awake. °· Only take over-the-counter or prescription medicines for pain, discomfort, or fever as directed by  your caregiver. °· Elevate your injured ankle above the level of your heart as much as possible for 2-3 days. °· If your caregiver recommends crutches, use them as instructed. Gradually put weight on the affected ankle. Continue to use crutches or a cane until you can walk without feeling pain in your ankle. °· If you have a plaster splint, wear the splint as directed by your caregiver. Do not rest it on anything harder than a pillow for the first 24 hours. Do not put weight on it. Do not get it wet. You may take it off to take a shower or bath. °· You may have been given an elastic bandage to wear around your ankle to provide support. If the elastic bandage is too tight (you have numbness or tingling in your foot or your foot becomes cold and blue), adjust the bandage to make it comfortable. °· If you have an air splint, you may blow more air into it or let air out to make it more comfortable. You may take your splint off at night and before taking a shower or bath. Wiggle your toes in the splint several times per day to decrease swelling. °SEEK MEDICAL CARE IF:  °· You have rapidly increasing bruising or swelling. °· Your toes feel extremely cold or you lose feeling in your foot. °· Your pain is not relieved with medicine. °SEEK IMMEDIATE MEDICAL CARE IF: °· Your toes are numb or blue. °·   You have severe pain that is increasing. MAKE SURE YOU:   Understand these instructions.  Will watch your condition.  Will get help right away if you are not doing well or get worse.   This information is not intended to replace advice given to you by your health care provider. Make sure you discuss any questions you have with your health care provider.   Document Released: 09/29/2005 Document Revised: 10/20/2014 Document Reviewed: 10/11/2011 Elsevier Interactive Patient Education 2016 [employer]  Rest with the foot elevated when seated. Apply ice to reduce swelling. Take the Naprosyn as needed. See Dr. Joice LoftsPoggi  for ongoing symptoms.

## 2015-09-08 NOTE — ED Notes (Signed)
Pt injured rt ankle yesterday playing basketball.

## 2015-09-08 NOTE — ED Notes (Signed)
States he rolled his right ankle while playing b/b yesterday  Right ankle swollen and tender   Pos.pulses and good circulation

## 2017-10-10 ENCOUNTER — Emergency Department
Admission: EM | Admit: 2017-10-10 | Discharge: 2017-10-10 | Disposition: A | Discharge status: Home / Self Care | Payer: Self-pay | Attending: Emergency Medicine | Admitting: Emergency Medicine

## 2017-10-10 ENCOUNTER — Encounter: Payer: Self-pay | Admitting: Emergency Medicine

## 2017-10-10 ENCOUNTER — Other Ambulatory Visit: Payer: Self-pay

## 2017-10-10 DIAGNOSIS — Z791 Long term (current) use of non-steroidal anti-inflammatories (NSAID): Secondary | ICD-10-CM | POA: Insufficient documentation

## 2017-10-10 DIAGNOSIS — F17210 Nicotine dependence, cigarettes, uncomplicated: Secondary | ICD-10-CM | POA: Insufficient documentation

## 2017-10-10 DIAGNOSIS — I10 Essential (primary) hypertension: Secondary | ICD-10-CM | POA: Insufficient documentation

## 2017-10-10 DIAGNOSIS — Z7982 Long term (current) use of aspirin: Secondary | ICD-10-CM | POA: Insufficient documentation

## 2017-10-10 DIAGNOSIS — R110 Nausea: Secondary | ICD-10-CM | POA: Insufficient documentation

## 2017-10-10 DIAGNOSIS — Z79899 Other long term (current) drug therapy: Secondary | ICD-10-CM | POA: Insufficient documentation

## 2017-10-10 NOTE — Discharge Instructions (Signed)
Follow up with your regular doctor if you are not better in 3-5 days, he may return to work tomorrow if you have not had any vomiting overnight

## 2017-10-10 NOTE — ED Provider Notes (Signed)
[facility] Emergency Department Provider Note  ____________________________________________   First MD Initiated Contact with Patient 10/10/17 1737     (approximate)  I have reviewed the triage vital signs and the nursing notes.   HISTORY  Chief Complaint Nausea    HPI Tony Taylor is a 26 y.o. male who states he drinks so much mellow yellow at work that he had dry heaves, he said he started to feel like he had to vomit while he was standing near a table but when he ran to the bathroom there was not anything left, he relates this directly to the amount of soda he drank today, he states his boss wanted him to get checked out and get a note to be able to return to work, he denies any vomiting or diarrhea, he denies any fever or chills, he does state that he is usually well  Past Medical History:  Diagnosis Date  . CVA (cerebral infarction)   . Hypertension     Patient Active Problem List   Diagnosis Date Noted  . Left hemiparesis (HCC) 07/16/2015  . Accelerated hypertension 07/16/2015    Past Surgical History:  Procedure Laterality Date  . NO PAST SURGERIES      Prior to Admission medications   Medication Sig Start Date End Date Taking? Authorizing Provider  amLODipine (NORVASC) 5 MG tablet Take 2 tablets (10 mg total) by mouth daily. 07/17/15   Adrian SaranMody, Sital, MD  aspirin (ASPIRIN CHILDRENS) 81 MG chewable tablet Chew 1 tablet (81 mg total) by mouth daily. 07/17/15   Adrian SaranMody, Sital, MD  atorvastatin (LIPITOR) 10 MG tablet Take 4 tablets (40 mg total) by mouth daily. 07/17/15   Adrian SaranMody, Sital, MD  metoprolol (LOPRESSOR) 50 MG tablet Take 50 mg by mouth 2 (two) times daily.    [provider]  naproxen (EC NAPROSYN) 500 MG EC tablet Take 1 tablet (500 mg total) by mouth 2 (two) times daily with a meal. 09/08/15   Menshew, Charlesetta IvoryJenise V Bacon, PA-C  nicotine (NICODERM CQ) 14 mg/24hr patch Place 1 patch (14 mg total) onto the skin daily. 07/17/15   Adrian SaranMody,  Sital, MD    Allergies Patient has no known allergies.  Family History  Problem Relation Age of Onset  . Stroke Mother     Social History Social History   Tobacco Use  . Smoking status: Current Every Day Smoker    Types: Cigarettes  . Smokeless tobacco: Never Used  Substance Use Topics  . Alcohol use: No  . Drug use: No    Review of Systems  Constitutional: No fever/chills Eyes: No visual changes. ENT: No sore throat. Respiratory: Denies cough ABD: Positive nausea Genitourinary: Negative for dysuria. Musculoskeletal: Negative for back pain. Skin: Negative for rash.    ____________________________________________   PHYSICAL EXAM:  VITAL SIGNS: ED Triage Vitals  Enc Vitals Group     BP 10/10/17 1713 (!) 194/132     Pulse Rate 10/10/17 1712 (!) 101     Resp 10/10/17 1712 16     Temp 10/10/17 1713 99 F (37.2 C)     Temp Source 10/10/17 1712 Oral     SpO2 10/10/17 1712 100 %     Weight 10/10/17 1712 270 lb (122.5 kg)     Height 10/10/17 1712 6\' 5"  (1.956 m)     Head Circumference --      Peak Flow --      Pain Score 10/10/17 1712 0  Pain Loc --      Pain Edu? --      Excl. in GC? --     Constitutional: Alert and oriented. Well appearing and in no acute distress. Eyes: Conjunctivae are normal.  Head: Atraumatic. Nose: No congestion/rhinnorhea. Mouth/Throat: Mucous membranes are moist.   Cardiovascular: Normal rate, regular rhythm.  Heart sounds are normal Respiratory: Normal respiratory effort.  No retractions, lungs are clear to auscultation ABD: Abdomen is soft, nontender, bowel sounds are normal GU: deferred Musculoskeletal: FROM all extremities, warm and well perfused Neurologic:  Normal speech and language.  Skin:  Skin is warm, dry and intact. No rash noted. Psychiatric: Mood and affect are normal. Speech and behavior are normal.  ____________________________________________   LABS (all labs ordered are listed, but only abnormal  results are displayed)  Labs Reviewed - No data to display ____________________________________________   ____________________________________________  RADIOLOGY    ____________________________________________   PROCEDURES  Procedure(s) performed: No      ____________________________________________   INITIAL IMPRESSION / ASSESSMENT AND PLAN / ED COURSE  Pertinent labs & imaging results that were available during my care of the patient were reviewed by me and considered in my medical decision making (see chart for details).  Patient is a 26 year old male who had dry heaves at work due to the amount of caffeine and soda he had ingested, on physical exam he appears normal, he was given a work note to return to work tomorrow, he was instructed to follow-up with his doctor for his blood pressure, he states he understands will comply with recommendation, he was discharged in stable condition     As part of my medical decision making, I reviewed the following data within the electronic MEDICAL RECORD NUMBER  ____________________________________________   FINAL CLINICAL IMPRESSION(S) / ED DIAGNOSES  Final diagnoses:  Nausea without vomiting      NEW MEDICATIONS STARTED DURING THIS VISIT:  This SmartLink is deprecated. Use AVSMEDLIST instead to display the medication list for a patient.   Note:  This document was prepared using Dragon voice recognition software and may include unintentional dictation errors.    Faythe GheeFisher, Susan W, PA-C 10/10/17 2352    Nita SickleVeronese, Magee, MD 10/11/17 [phone]

## 2017-10-10 NOTE — ED Triage Notes (Addendum)
Pt here from work with c/o "dry heaves". Pt reports dry heaving 3 times at work and needs to be cleared and a note for work. Pt denies any vomiting or diarrhea, denies any abdominal pain; think he has been drinking too much soda.

## 2018-06-17 ENCOUNTER — Emergency Department: Payer: Self-pay

## 2018-06-17 ENCOUNTER — Other Ambulatory Visit: Payer: Self-pay

## 2018-06-17 ENCOUNTER — Inpatient Hospital Stay
Admission: EM | Admit: 2018-06-17 | Discharge: 2018-06-21 | DRG: 065 | Disposition: A | Discharge status: Other Acute Inpatient Hospital | Payer: Self-pay | Attending: Internal Medicine | Admitting: Internal Medicine

## 2018-06-17 ENCOUNTER — Inpatient Hospital Stay
Admission: EM | Admit: 2018-06-17 | Discharge: 2018-06-17 | DRG: 065 | Discharge status: Against Medical Advice | Payer: Self-pay | Attending: Internal Medicine | Admitting: Internal Medicine

## 2018-06-17 ENCOUNTER — Inpatient Hospital Stay (HOSPITAL_COMMUNITY)
Admit: 2018-06-17 | Discharge: 2018-06-17 | Disposition: A | Discharge status: Home / Self Care | Payer: Self-pay | Attending: Internal Medicine | Admitting: Internal Medicine

## 2018-06-17 ENCOUNTER — Encounter: Payer: Self-pay | Admitting: Emergency Medicine

## 2018-06-17 DIAGNOSIS — G8191 Hemiplegia, unspecified affecting right dominant side: Secondary | ICD-10-CM | POA: Diagnosis present

## 2018-06-17 DIAGNOSIS — N182 Chronic kidney disease, stage 2 (mild): Secondary | ICD-10-CM | POA: Diagnosis present

## 2018-06-17 DIAGNOSIS — Z8673 Personal history of transient ischemic attack (TIA), and cerebral infarction without residual deficits: Secondary | ICD-10-CM

## 2018-06-17 DIAGNOSIS — I129 Hypertensive chronic kidney disease with stage 1 through stage 4 chronic kidney disease, or unspecified chronic kidney disease: Secondary | ICD-10-CM | POA: Diagnosis present

## 2018-06-17 DIAGNOSIS — R29700 NIHSS score 0: Secondary | ICD-10-CM | POA: Diagnosis present

## 2018-06-17 DIAGNOSIS — R402142 Coma scale, eyes open, spontaneous, at arrival to emergency department: Secondary | ICD-10-CM | POA: Diagnosis present

## 2018-06-17 DIAGNOSIS — R066 Hiccough: Secondary | ICD-10-CM | POA: Diagnosis present

## 2018-06-17 DIAGNOSIS — I6322 Cerebral infarction due to unspecified occlusion or stenosis of basilar arteries: Secondary | ICD-10-CM

## 2018-06-17 DIAGNOSIS — I10 Essential (primary) hypertension: Secondary | ICD-10-CM | POA: Diagnosis present

## 2018-06-17 DIAGNOSIS — F17210 Nicotine dependence, cigarettes, uncomplicated: Secondary | ICD-10-CM | POA: Diagnosis present

## 2018-06-17 DIAGNOSIS — R29702 NIHSS score 2: Secondary | ICD-10-CM | POA: Diagnosis not present

## 2018-06-17 DIAGNOSIS — I5030 Unspecified diastolic (congestive) heart failure: Secondary | ICD-10-CM

## 2018-06-17 DIAGNOSIS — I161 Hypertensive emergency: Secondary | ICD-10-CM

## 2018-06-17 DIAGNOSIS — R1310 Dysphagia, unspecified: Secondary | ICD-10-CM | POA: Diagnosis present

## 2018-06-17 DIAGNOSIS — K219 Gastro-esophageal reflux disease without esophagitis: Secondary | ICD-10-CM | POA: Diagnosis present

## 2018-06-17 DIAGNOSIS — I639 Cerebral infarction, unspecified: Principal | ICD-10-CM

## 2018-06-17 DIAGNOSIS — E785 Hyperlipidemia, unspecified: Secondary | ICD-10-CM | POA: Diagnosis present

## 2018-06-17 DIAGNOSIS — Z6832 Body mass index (BMI) 32.0-32.9, adult: Secondary | ICD-10-CM

## 2018-06-17 DIAGNOSIS — R402252 Coma scale, best verbal response, oriented, at arrival to emergency department: Secondary | ICD-10-CM | POA: Diagnosis present

## 2018-06-17 DIAGNOSIS — R402362 Coma scale, best motor response, obeys commands, at arrival to emergency department: Secondary | ICD-10-CM | POA: Diagnosis present

## 2018-06-17 DIAGNOSIS — R29701 NIHSS score 1: Secondary | ICD-10-CM | POA: Diagnosis not present

## 2018-06-17 DIAGNOSIS — E669 Obesity, unspecified: Secondary | ICD-10-CM | POA: Diagnosis present

## 2018-06-17 DIAGNOSIS — Z823 Family history of stroke: Secondary | ICD-10-CM

## 2018-06-17 DIAGNOSIS — Z9119 Patient's noncompliance with other medical treatment and regimen: Secondary | ICD-10-CM

## 2018-06-17 DIAGNOSIS — I63212 Cerebral infarction due to unspecified occlusion or stenosis of left vertebral arteries: Secondary | ICD-10-CM

## 2018-06-17 DIAGNOSIS — I69354 Hemiplegia and hemiparesis following cerebral infarction affecting left non-dominant side: Secondary | ICD-10-CM

## 2018-06-17 DIAGNOSIS — R29703 NIHSS score 3: Secondary | ICD-10-CM | POA: Diagnosis not present

## 2018-06-17 DIAGNOSIS — I6340 Cerebral infarction due to embolism of unspecified cerebral artery: Secondary | ICD-10-CM

## 2018-06-17 LAB — DIFFERENTIAL
BASOS ABS: 0 10*3/uL (ref 0–0.1)
Basophils Relative: 0 %
Eosinophils Absolute: 0.2 10*3/uL (ref 0–0.7)
Eosinophils Relative: 4 %
LYMPHS PCT: 23 %
Lymphs Abs: 1.4 10*3/uL (ref 1.0–3.6)
Monocytes Absolute: 0.4 10*3/uL (ref 0.2–1.0)
Monocytes Relative: 7 %
NEUTROS ABS: 4.1 10*3/uL (ref 1.4–6.5)
NEUTROS PCT: 66 %

## 2018-06-17 LAB — COMPREHENSIVE METABOLIC PANEL
ALBUMIN: 4.4 g/dL (ref 3.5–5.0)
ALK PHOS: 83 U/L (ref 38–126)
ALT: 27 U/L (ref 0–44)
AST: 20 U/L (ref 15–41)
Anion gap: 6 (ref 5–15)
BILIRUBIN TOTAL: 0.9 mg/dL (ref 0.3–1.2)
BUN: 10 mg/dL (ref 6–20)
CALCIUM: 9.4 mg/dL (ref 8.9–10.3)
CO2: 26 mmol/L (ref 22–32)
Chloride: 106 mmol/L (ref 98–111)
Creatinine, Ser: 1.31 mg/dL — ABNORMAL HIGH (ref 0.61–1.24)
GFR calc Af Amer: >60 mL/min (ref >60)
GFR calc non Af Amer: >60 mL/min (ref >60)
GLUCOSE: 97 mg/dL (ref 70–99)
Potassium: 3.9 mmol/L (ref 3.5–5.1)
SODIUM: 138 mmol/L (ref 135–145)
TOTAL PROTEIN: 7.3 g/dL (ref 6.5–8.1)

## 2018-06-17 LAB — CBC
HCT: 43.8 % (ref 40.0–52.0)
Hemoglobin: 14.9 g/dL (ref 13.0–18.0)
MCH: 31.1 pg (ref 26.0–34.0)
MCHC: 34.1 g/dL (ref 32.0–36.0)
MCV: 91.2 fL (ref 80.0–100.0)
PLATELETS: 196 10*3/uL (ref 150–440)
RBC: 4.8 MIL/uL (ref 4.40–5.90)
RDW: 12.9 % (ref 11.5–14.5)
WBC: 6.1 10*3/uL (ref 3.8–10.6)

## 2018-06-17 LAB — GLUCOSE, CAPILLARY
Glucose-Capillary: 110 mg/dL — ABNORMAL HIGH (ref 70–99)
Glucose-Capillary: 84 mg/dL (ref 70–99)

## 2018-06-17 LAB — TROPONIN I: Troponin I: <0.03 ng/mL (ref <0.03)

## 2018-06-17 LAB — ECHOCARDIOGRAM COMPLETE
Height: 77 in
WEIGHTICAEL: 4400 [oz_av]

## 2018-06-17 LAB — MRSA PCR SCREENING: MRSA BY PCR: NEGATIVE

## 2018-06-17 LAB — LIPID PANEL
Cholesterol: 177 mg/dL (ref 0–200)
HDL: 33 mg/dL — ABNORMAL LOW (ref >40)
LDL CALC: 118 mg/dL — AB (ref 0–99)
Total CHOL/HDL Ratio: 5.4 RATIO
Triglycerides: 131 mg/dL (ref <150)
VLDL: 26 mg/dL (ref 0–40)

## 2018-06-17 MED ORDER — AMLODIPINE BESYLATE 5 MG PO TABS
ORAL_TABLET | ORAL | Status: AC
  Filled 2018-06-17: qty 2

## 2018-06-17 MED ORDER — LABETALOL HCL 5 MG/ML IV SOLN
10.0000 mg | INTRAVENOUS | Status: DC | PRN
  Administered 2018-06-17 – 2018-06-19 (×5): 10 mg via INTRAVENOUS
  Filled 2018-06-17 (×5): qty 4

## 2018-06-17 MED ORDER — NICARDIPINE HCL IN NACL 20-0.86 MG/200ML-% IV SOLN
INTRAVENOUS | Status: AC
  Filled 2018-06-17: qty 200

## 2018-06-17 MED ORDER — SODIUM CHLORIDE 0.9 % IV SOLN
12.5000 mg | Freq: Three times a day (TID) | INTRAVENOUS | Status: DC | PRN
  Administered 2018-06-18: 12.5 mg via INTRAVENOUS
  Filled 2018-06-17: qty 0.5

## 2018-06-17 MED ORDER — ALUM & MAG HYDROXIDE-SIMETH 200-200-20 MG/5ML PO SUSP
30.0000 mL | ORAL | Status: DC | PRN
  Administered 2018-06-17: 30 mL via ORAL
  Filled 2018-06-17 (×2): qty 30

## 2018-06-17 MED ORDER — LABETALOL HCL 5 MG/ML IV SOLN
INTRAVENOUS | Status: AC
  Filled 2018-06-17: qty 4

## 2018-06-17 MED ORDER — ENOXAPARIN SODIUM 40 MG/0.4ML ~~LOC~~ SOLN: 40.0000 mg | SUBCUTANEOUS | Status: DC

## 2018-06-17 MED ORDER — ASPIRIN 325 MG PO TABS: 325.0000 mg | ORAL_TABLET | Freq: Every day | ORAL | Status: DC

## 2018-06-17 MED ORDER — ONDANSETRON HCL 4 MG/2ML IJ SOLN
4.0000 mg | Freq: Four times a day (QID) | INTRAMUSCULAR | Status: DC | PRN
  Administered 2018-06-17: 4 mg via INTRAVENOUS

## 2018-06-17 MED ORDER — ACETAMINOPHEN 160 MG/5ML PO SOLN
650.0000 mg | ORAL | Status: DC | PRN
  Filled 2018-06-17: qty 20.3

## 2018-06-17 MED ORDER — STROKE: EARLY STAGES OF RECOVERY BOOK: Freq: Once | Status: DC

## 2018-06-17 MED ORDER — ONDANSETRON HCL 4 MG/2ML IJ SOLN
INTRAMUSCULAR | Status: AC
  Filled 2018-06-17: qty 2

## 2018-06-17 MED ORDER — ACETAMINOPHEN 650 MG RE SUPP: 650.0000 mg | RECTAL | Status: DC | PRN

## 2018-06-17 MED ORDER — LORAZEPAM 2 MG/ML IJ SOLN
1.0000 mg | Freq: Once | INTRAMUSCULAR | Status: AC | PRN
Start: 2018-06-17 — End: 2018-06-17
  Administered 2018-06-17: 1 mg via INTRAVENOUS
  Filled 2018-06-17: qty 1

## 2018-06-17 MED ORDER — NICARDIPINE HCL IN NACL 20-0.86 MG/200ML-% IV SOLN
3.0000 mg/h | Freq: Once | INTRAVENOUS | Status: AC
  Administered 2018-06-17: 5 mg/h via INTRAVENOUS
  Filled 2018-06-17: qty 200

## 2018-06-17 MED ORDER — ACETAMINOPHEN 325 MG PO TABS: 650.0000 mg | ORAL_TABLET | ORAL | Status: DC | PRN

## 2018-06-17 MED ORDER — AMLODIPINE BESYLATE 5 MG PO TABS
5.0000 mg | ORAL_TABLET | Freq: Every day | ORAL | Status: DC
  Administered 2018-06-17: 5 mg via ORAL

## 2018-06-17 MED ORDER — HYDRALAZINE HCL 50 MG PO TABS
50.0000 mg | ORAL_TABLET | Freq: Three times a day (TID) | ORAL | Status: DC
  Administered 2018-06-17: 50 mg via ORAL

## 2018-06-17 MED ORDER — HYDRALAZINE HCL 50 MG PO TABS
25.0000 mg | ORAL_TABLET | Freq: Three times a day (TID) | ORAL | Status: DC
  Administered 2018-06-18 (×2): 25 mg via ORAL
  Filled 2018-06-17: qty 1

## 2018-06-17 MED ORDER — AMLODIPINE BESYLATE 5 MG PO TABS
ORAL_TABLET | ORAL | Status: AC
  Filled 2018-06-17: qty 1

## 2018-06-17 MED ORDER — HYDRALAZINE HCL 50 MG PO TABS
ORAL_TABLET | ORAL | Status: AC
  Filled 2018-06-17: qty 1

## 2018-06-17 MED ORDER — LABETALOL HCL 5 MG/ML IV SOLN
10.0000 mg | Freq: Once | INTRAVENOUS | Status: DC
Start: 2018-06-17 — End: 2018-06-17

## 2018-06-17 MED ORDER — SODIUM CHLORIDE 0.9 % IV SOLN
INTRAVENOUS | Status: DC
  Administered 2018-06-17: 75 mL/h via INTRAVENOUS

## 2018-06-17 MED ORDER — AMLODIPINE BESYLATE 5 MG PO TABS
10.0000 mg | ORAL_TABLET | Freq: Every day | ORAL | Status: DC
  Administered 2018-06-17 – 2018-06-19 (×3): 10 mg via ORAL
  Filled 2018-06-17 (×2): qty 2

## 2018-06-17 MED ORDER — ASPIRIN 81 MG PO CHEW
324.0000 mg | CHEWABLE_TABLET | Freq: Once | ORAL | Status: AC
Start: 2018-06-17 — End: 2018-06-17
  Administered 2018-06-17: 324 mg via ORAL
  Filled 2018-06-17: qty 4

## 2018-06-17 MED ORDER — FAMOTIDINE IN NACL 20-0.9 MG/50ML-% IV SOLN
20.0000 mg | Freq: Once | INTRAVENOUS | Status: AC
  Administered 2018-06-18: 20 mg via INTRAVENOUS
  Filled 2018-06-17: qty 50

## 2018-06-17 MED ORDER — ASPIRIN EC 325 MG PO TBEC: 325.0000 mg | DELAYED_RELEASE_TABLET | Freq: Every day | ORAL | Status: DC

## 2018-06-17 MED ORDER — ASPIRIN 300 MG RE SUPP: 300.0000 mg | Freq: Every day | RECTAL | Status: DC

## 2018-06-17 NOTE — Progress Notes (Signed)
Patient refuses to keep BP cuff on, removed his SpO2 sensor, and urinated in the floor beside the bed. Patient had previously used a urinal w/o difficulty. Patient very agitated stating "someboy needs to get me the fuck out of here". I educated the patient on strokes, his condition, and why he is here in the ICU. The patient continues to moan and talk to someone on the phone stating the same about leaving. Oncoming RN advised of same.

## 2018-06-17 NOTE — ED Provider Notes (Signed)
[facility] Emergency Department Provider Note ____________________________________________   I have reviewed the triage vital signs and the triage nursing note.  HISTORY  Chief Complaint Weakness   Historian Patient  HPI Tony Taylor is a 27 y.o. male with a history of prior stroke, as well as hypertension for which she has been off of medications it sounds like for a few years, presents with 3 days of right sided weakness.  When I further discussed the symptoms he states that it is not grip strength or biceps, but when he is walking he feels like the leg on the right feels funny, and that he cannot open and close his hands and do rapid finger movements as quick as he could previously, for about 3 days now.  He also had some trouble finding his words.  He states he is also having hiccups.  States is been off medication because he did not like the way metoprolol made him feel and then he tried his father's Norvasc and it did not seem to work.  He has been off of medication for a couple of years.  Symptoms are moderate.     Past Medical History:  Diagnosis Date  . CVA (cerebral infarction)   . Hypertension     Patient Active Problem List   Diagnosis Date Noted  . Left hemiparesis (HCC) 07/16/2015  . Accelerated hypertension 07/16/2015    Past Surgical History:  Procedure Laterality Date  . NO PAST SURGERIES      Prior to Admission medications   Not on File    No Known Allergies  Family History  Problem Relation Age of Onset  . Stroke Mother     Social History Social History   Tobacco Use  . Smoking status: Current Every Day Smoker    Types: Cigarettes  . Smokeless tobacco: Never Used  Substance Use Topics  . Alcohol use: No  . Drug use: No    Review of Systems  Constitutional: Negative for fever. Eyes: Negative for visual changes. ENT: Negative for sore throat. Cardiovascular: Negative for chest pain. Respiratory: Negative  for shortness of breath. Gastrointestinal: Negative for abdominal pain, vomiting and diarrhea. Genitourinary: Negative for dysuria. Musculoskeletal: Negative for back pain. Skin: Negative for rash. Neurological: Positive for occasional headaches.   ____________________________________________   PHYSICAL EXAM:  VITAL SIGNS: ED Triage Vitals  Enc Vitals Group     BP 06/17/18 0931 (!) 236/149     Pulse Rate 06/17/18 0931 (!) 55     Resp 06/17/18 0931 18     Temp 06/17/18 0931 98.5 F (36.9 C)     Temp Source 06/17/18 0931 Oral     SpO2 06/17/18 0931 100 %     Weight 06/17/18 0932 275 lb (124.7 kg)     Height 06/17/18 0932 6\' 5"  (1.956 m)     Head Circumference --      Peak Flow --      Pain Score 06/17/18 0932 0     Pain Loc --      Pain Edu? --      Excl. in GC? --      Constitutional: Alert and oriented.  HEENT      Head: Normocephalic and atraumatic.      Eyes: Conjunctivae are normal. Pupils equal and round.       Ears:         Nose: No congestion/rhinnorhea.      Mouth/Throat: Mucous membranes are moist.  Neck: No stridor. Cardiovascular/Chest: Normal rate, regular rhythm.  No murmurs, rubs, or gallops. Respiratory: Normal respiratory effort without tachypnea nor retractions. Breath sounds are clear and equal bilaterally. No wheezes/rales/rhonchi. Gastrointestinal: Soft. No distention, no guarding, no rebound. Nontender.    Genitourinary/rectal:Deferred Musculoskeletal: Nontender with normal range of motion in all extremities. No joint effusions.  No lower extremity tenderness.  No edema. Neurologic: No facial droop.  No slurred speech, occasionally seems to be taking a bit of time to think of his words.  5 out of 5 strength in bilateral upper and lower extremities.  Reports that his left side does not feel quite normal but that is chronic.  Reports normal sensation to the right side.  Grips is hand and states that does not feel as fast as normal.   Skin:  Skin is  warm, dry and intact. No rash noted. Psychiatric: Mood and affect are normal. Speech and behavior are normal. Patient exhibits appropriate insight and judgment.   ____________________________________________  LABS (pertinent positives/negatives) I, Governor Rooks, MD the attending physician have reviewed the labs noted below.  Labs Reviewed  COMPREHENSIVE METABOLIC PANEL - Abnormal; Notable for the following components:      Result Value   Creatinine, Ser 1.31 (*)    All other components within normal limits  GLUCOSE, CAPILLARY - Abnormal; Notable for the following components:   Glucose-Capillary 110 (*)    All other components within normal limits  CBC  DIFFERENTIAL  TROPONIN I  CBG MONITORING, ED    ____________________________________________    EKG I, Governor Rooks, MD, the attending physician have personally viewed and interpreted all ECGs.  57 bpm.  Uncertain rhythm.  Ventricular beats/left bundle versus normal sinus rhythm with narrow QRS. ____________________________________________  RADIOLOGY  CT head without contrast.  I reviewed the radiologist interpretation:  IMPRESSION: Decreased attenuation in the lower right pons at the site of prior infarct. Elsewhere gray-white compartments appear normal. No acute infarct evident. No mass or hemorrhage.  Study otherwise unremarkable.   MRI brain without contrast: I reviewed the radiologist interpretation:  IMPRESSION: Acute infarct in the left pons. Acute infarct in the right posterior external capsule  Relatively large chronic infarct in the right pons __________________________________________  PROCEDURES  Procedure(s) performed: None  Procedures  Critical Care performed: CRITICAL CARE Performed by: Governor Rooks   Total critical care time: 30 minutes  Critical care time was exclusive of separately billable procedures and treating other patients.  Critical care was necessary to treat or prevent  imminent or life-threatening deterioration.  Critical care was time spent personally by me on the following activities: development of treatment plan with patient and/or surrogate as well as nursing, discussions with consultants, evaluation of patient's response to treatment, examination of patient, obtaining history from patient or surrogate, ordering and performing treatments and interventions, ordering and review of laboratory studies, ordering and review of radiographic studies, pulse oximetry and re-evaluation of patient's condition.    ____________________________________________  ED COURSE / ASSESSMENT AND PLAN  Pertinent labs & imaging results that were available during my care of the patient were reviewed by me and considered in my medical decision making (see chart for details).   Patient arrived with some neurologic complaints although on exam he does not have any weakness that I can identify or really new sensory changes but he is pretty clear that he feels like he is having some speech changes as well as right sided symptoms.  His blood pressure is extremely high although it sound  like it has been chronically elevated, concern for hypertensive emergency.  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  However given his symptoms, I did feel MRI was necessary for further investigation.  For hypertensive emergency, avoided beta-blockers due to heart rate in the 50s.  Started patient on nicardipine drip with a goal blood pressure reduction for map no more than 25%, map goal 120.  MRI results found upon reviewing the results to be positive for an acute pontine stroke on the left.    Discussed the results with the patient.  Reviewed blood pressure down to 130 map.     CONSULTATIONS:   Hospitalist for admission.   Patient / Family / Caregiver informed of clinical course, medical decision-making process, and agree with  plan.    ___________________________________________   FINAL CLINICAL IMPRESSION(S) / ED DIAGNOSES   Final diagnoses:  Arterial ischemic stroke, vertebrobasilar, brainstem, acute, left (HCC)      ___________________________________________         Note: This dictation was prepared with Dragon dictation. Any transcriptional errors that result from this process are unintentional    Governor Rooks, MD 06/17/18 1341

## 2018-06-17 NOTE — ED Notes (Signed)
Patient transported to CT 

## 2018-06-17 NOTE — ED Triage Notes (Signed)
Patient reports waking up 2 days ago and feeling significant weakness on the right side. States history of stroke that affected left side. Patient complaining of continued weakness to right side. Patient also reports tremors and trouble swallowing. Equal grips and sensation noted in triage.

## 2018-06-17 NOTE — ED Triage Notes (Signed)
Pt reports that he was in the ICU for hiccups and left AMA,but since leaving he has had his hiccups return again before leaving the parking lot, so he has checked back in

## 2018-06-17 NOTE — ED Notes (Signed)
While conducting patient PTA medication interview, patient experienced a sudden fir of hiccups. Patient reported that frequent bouts of hiccups as an additional symptom prior to this ED encounter. He reported that small things could trigger these episodes (e.g. a drop of saliva unexpectedly going "down the wrong pipe") and that these episodes had become frequent enough that he could tell what would trigger them. He reports they could last upwards of 20 minutes. Information verbally relayed to MD Lord.  ** The above is intended solely for informational and/or communicative purposes. It should in no way be considered an endorsement of any specific treatment, therapy or action. **

## 2018-06-17 NOTE — Progress Notes (Signed)
eLink Physician-Brief Progress Note Patient Name: Tony Taylor DOB: Jan 14, 1991 MRN: [id number]   Date of Service  06/17/2018  HPI/Events of Note    eICU Interventions  Normal saline infusion discontinued.        Thomasene Lot Everlynn Sagun 06/17/2018, 9:06 PM

## 2018-06-17 NOTE — H&P (Signed)
[facility] - [place] [facility]   PATIENT NAME: Tony Taylor    MR#:  [id number]  DATE OF BIRTH:  Mar 16, 1991  DATE OF ADMISSION:  06/17/2018  PRIMARY CARE PHYSICIAN: Patient, No Pcp Per   REQUESTING/REFERRING PHYSICIAN: Dr. Shaune Pollack  CHIEF COMPLAINT:  weakness  HISTORY OF PRESENT ILLNESS:  Tony Taylor  is a 27 y.o. male with a known history of uncontrolled hypertension, history of prior stroke, history of tobacco abuse not on any medication secondary to noncompliance comes to the emergency room with three days of right-sided weakness. Patient states he feels he funning his leg when he is walking and not able to properly close and open his hand and do rapid finger movement as he could. Patient also complains with slowing of some words. Denies any visual problem or any difficulty swallowing in the emergency room blood pressure was 226/124 on arrival. During my evaluation patient was started on IV Nicardipine drip patient had MRI done which showed Acute infarct in the left pons. Acute infarct in the right posterior external capsule.  Patient was given oral aspirin. He is being admitted with acute stroke in the setting of hypertensive urgency. Discussed with ICU attending Dr. Duanne Limerick.  PAST MEDICAL HISTORY:   Past Medical History:  Diagnosis Date  . CVA (cerebral infarction)   . Hypertension     PAST SURGICAL HISTOIRY:   Past Surgical History:  Procedure Laterality Date  . NO PAST SURGERIES      SOCIAL HISTORY:   Social History   Tobacco Use  . Smoking status: Current Every Day Smoker    Types: Cigarettes  . Smokeless tobacco: Never Used  Substance Use Topics  . Alcohol use: No    FAMILY HISTORY:   Family History  Problem Relation Age of Onset  . Stroke Mother     DRUG ALLERGIES:  No Known Allergies  REVIEW OF SYSTEMS:  Review of Systems  Constitutional: Negative for chills, fever and weight loss.  HENT: Negative for ear  discharge, ear pain and nosebleeds.   Eyes: Negative for blurred vision, pain and discharge.  Respiratory: Negative for sputum production, shortness of breath, wheezing and stridor.   Cardiovascular: Negative for chest pain, palpitations, orthopnea and PND.  Gastrointestinal: Negative for abdominal pain, diarrhea, nausea and vomiting.  Genitourinary: Negative for frequency and urgency.  Musculoskeletal: Negative for back pain and joint pain.  Neurological: Positive for tingling and weakness. Negative for sensory change, speech change and focal weakness.  Psychiatric/Behavioral: Negative for depression and hallucinations. The patient is not nervous/anxious.      MEDICATIONS AT HOME:   Prior to Admission medications   Not on File     VITAL SIGNS:  Blood pressure (!) 178/93, pulse 85, temperature (!) 100.4 F (38 C), resp. rate (!) 26, height 6\' 5"  (1.956 m), weight 124.7 kg, SpO2 100 %.  PHYSICAL EXAMINATION:  GENERAL:  27 y.o.-year-old patient lying in the bed with no acute distress.  EYES: Pupils equal, round, reactive to light and accommodation. No scleral icterus. Extraocular muscles intact.  HEENT: Head atraumatic, normocephalic. Oropharynx and nasopharynx clear.  NECK:  Supple, no jugular venous distention. No thyroid enlargement, no tenderness.  LUNGS: Normal breath sounds bilaterally, no wheezing, rales,rhonchi or crepitation. No use of accessory muscles of respiration.  CARDIOVASCULAR: S1, S2 normal. No murmurs, rubs, or gallops.  ABDOMEN: Soft, nontender, nondistended. Bowel sounds present. No organomegaly or mass.  EXTREMITIES: No pedal edema, cyanosis, or clubbing.  NEUROLOGIC: Cranial  nerves II through XII are intact. Muscle strength 5/5 in all extremities. Sensation intact. Gait not checked.  PSYCHIATRIC: The patient is alert and oriented x 3.  SKIN: No obvious rash, lesion, or ulcer.   LABORATORY PANEL:   CBC Recent Labs  Lab 06/17/18 1019  WBC 6.1  HGB 14.9   HCT 43.8  PLT 196   ------------------------------------------------------------------------------------------------------------------  Chemistries  Recent Labs  Lab 06/17/18 1019  NA 138  K 3.9  CL 106  CO2 26  GLUCOSE 97  BUN 10  CREATININE 1.31*  CALCIUM 9.4  AST 20  ALT 27  ALKPHOS 83  BILITOT 0.9   ------------------------------------------------------------------------------------------------------------------  Cardiac Enzymes Recent Labs  Lab 06/17/18 1019  TROPONINI <0.03   ------------------------------------------------------------------------------------------------------------------  RADIOLOGY:  Ct Head Wo Contrast  Result Date: 06/17/2018 CLINICAL DATA:  Right-sided weakness and dysphagia. History of right-sided pontine infarct. EXAM: CT HEAD WITHOUT CONTRAST TECHNIQUE: Contiguous axial images were obtained from the base of the skull through the vertex without intravenous contrast. COMPARISON:  Head CT July 16, 2015; brain MRI July 17, 2015 FINDINGS: Brain: The ventricles are normal in size and configuration. There is no evident intracranial mass, hemorrhage, extra-axial fluid collection, or midline shift. Decreased attenuation in the right lower pons is noted, consistent with prior documented infarct in this area. No new gray-white compartment lesions are evident. No acute infarct is evident by CT on this study. Vascular: No hyperdense vessel. There is no appreciable vascular calcification. Skull: The bony calvarium appears intact. Sinuses/Orbits: Visualized paranasal sinuses are clear. Visualized orbits appear symmetric bilaterally. Other: Mastoid air cells are clear. IMPRESSION: Decreased attenuation in the lower right pons at the site of prior infarct. Elsewhere gray-white compartments appear normal. No acute infarct evident. No mass or hemorrhage. Study otherwise unremarkable. Electronically Signed   By: Bretta Bang III M.D.   On: 06/17/2018 10:27    Mr Brain Wo Contrast  Result Date: 06/17/2018 CLINICAL DATA:  Abnormal speech.  History of stroke EXAM: MRI HEAD WITHOUT CONTRAST TECHNIQUE: Multiplanar, multiecho pulse sequences of the brain and surrounding structures were obtained without intravenous contrast. COMPARISON:  CT head 06/17/2018.  MRI 07/17/2015 FINDINGS: Brain: Acute infarct in the left pons. 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 Acute infarct in the posterior external capsule on the right. Scattered small white matter hyperintensities bilaterally consistent with chronic ischemia, mild. Ventricle size normal. Negative for hemorrhage or mass. No midline shift. Vascular: Normal arterial flow void Skull and upper cervical spine: Negative Sinuses/Orbits: Negative Other: None IMPRESSION: Acute infarct in the left pons. Acute infarct in the right posterior external capsule Relatively large chronic infarct in the right pons Electronically Signed   By: Marlan Palau M.D.   On: 06/17/2018 13:01    EKG:   Sinus rhythm with LVH and repolarization IMPRESSION AND PLAN:   Layne Dilauro  is a 27 y.o. male with a known history of uncontrolled hypertension, history of prior stroke, history of tobacco abuse not on any medication secondary to noncompliance comes to the emergency room with three days of right-sided weakness. Patient states he feels he funning his leg when he is walking and not able to properly close and open his hand and do rapid finger movement as he could. Patient also complains with slowing of some words. Denies any visual problem or any difficulty swallowing in the emergency room blood pressure was 226/124 on arrival.  1. 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 Dr. Thad Ranger aware -continue aspirin daily -ultrasound carotid, speech therapy, occupational therapy, physical therapy, echo  of the heart -lipid profile  2. hypertensive urgency -patient has history of hypertension noncompliant to meds -IV Nicardipine drip -start hydralazine and Norvasc -patient reports he did not do well and felt Swimmy headed with metoprolol in the past.  3. Tobacco abuse advised cessation  4. 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  5.DVT prophylaxis Lovenox  No family in the ER patient was advised regarding compliance to medication to avoid further strokes and organ damage. He will need outpatient appointment with primary care physician care management for discharge planning  All the records are reviewed and case discussed with ED provider. Management plans discussed with the patient, family and they are in agreement.  CODE STATUS: full  TOTAL  criticalTIME TAKING CARE OF THIS PATIENT: *55 minutes.    Enedina Finner M.D on 06/17/2018 at 4:11 PM  Between 7am to 6pm - Pager - [phone]  After 6pm go to [email or web address] - password [email or web address] [email or web address]  [facility]  Office  [phone]  CC: Primary care physician; Patient, No Pcp Per

## 2018-06-17 NOTE — Consult Note (Signed)
   Name: Tony Taylor MRN: [id number] DOB: [birth year]     CONSULTATION DATE: 06/17/2018  HISTORY OF PRESENT ILLNESS:    27 years old man with history of uncontrolled hypertension, CVA, chronic tobacco abuse and medical noncompliance.  Patient presented to the emergency room complaining of right-sided weakness for the last 3 days while in the emergency room he was found to have high blood pressure recorded as 226/124 and had to be started on Cardene drip.  MRI was remarkable for acute infarct in left troponins and the right posterior external capsule.  Patient was not a candidate for thrombolytic therapy because of delayed onset of symptoms. All history was obtained from admitting physician who mentioned that the case was discussed with Dr. Thad Ranger from neurology and the patient was started on aspirin. Patient arrived to the intensive care unit awake in no distress on Cardene drip. PAST MEDICAL HISTORY :   has a past medical history of CVA (cerebral infarction) and Hypertension.  has a past surgical history that includes No past surgeries. Prior to Admission medications   Not on File   No Known Allergies  FAMILY HISTORY:  family history includes Stroke in his mother. SOCIAL HISTORY:  reports that he has been smoking cigarettes. He has never used smokeless tobacco. He reports that he does not drink alcohol or use drugs.  REVIEW OF SYSTEMS:   Unable to obtain due to critical illness   VITAL SIGNS: Temp:  [98.5 F (36.9 C)-100.4 F (38 C)] 100.4 F (38 C) (09/05 1600) Pulse Rate:  [48-97] 85 (09/05 1600) Resp:  [13-26] 26 (09/05 1600) BP: (159-240)/(90-149) 178/93 (09/05 1600) SpO2:  [97 %-100 %] 100 % (09/05 1600) Weight:  [124.7 kg] 124.7 kg (09/05 0932)  Physical Examination:  Awake and oriented with no acute focal motor deficits.  Detailed neuro exam as per neurology Tolerating nasal cannula, no distress, able to talk in full sentences, bilateral equal air entry with no  adventitious sounds S1 & S2 are audible with no murmur Benign abdominal exam with normal peristalsis Within normal extremities and no peripheral edema  ASSESSMENT / PLAN:  Hypertensive emergency with CVA.  MRI revealed acute infarct left pons, right posterior external capsule and a chronic infarct right pons.  He was not a candidate for TPA because of onset of symptoms almost 3 days ago -Optimize antihypertensives and monitor hemodynamics -Aspirin and management as per neurology -Allow for permissive hypertension for systolic up to 220 -Continue to monitor neuro status  Dyslipidemia -Statin once passed swallow evaluation  Full code  Supportive care  Critical care time 40 minutes

## 2018-06-17 NOTE — Progress Notes (Signed)
Pt requested to leave AmA. . Pt advised by the nurse and MD the risk involed with leaving AMA , but he insisted to leave. Pt signed papers and IV's taken out and disconnected from monitor. Pt called for his ride grabbed his belongings and left out the building.

## 2018-06-17 NOTE — ED Notes (Signed)
Pt cannot have pump in MRI. Cardene stopped for pt to go to MRI. Will resume when pt returns from MRI.

## 2018-06-17 NOTE — ED Provider Notes (Signed)
[facility] Emergency Department Provider Note    First MD Initiated Contact with Patient 06/17/18 2209     (approximate)  I have reviewed the triage vital signs and the nursing notes.   HISTORY  Chief Complaint Hiccups    HPI Tony Taylor is a 27 y.o. male who presents 10 minutes after leaving AMA from the ICU after being admitted for acute stroke.  Patient with extensive past medical history of poorly controlled hypertension presents.  Denies any new complaints.  No nausea or vomiting.  States he still having hiccups and has significant weakness in the right side of his body.  Is now agreeable to admission to the hospital.   Past Medical History:  Diagnosis Date  . CVA (cerebral infarction)   . Hypertension    Family History  Problem Relation Age of Onset  . Stroke Mother    Past Surgical History:  Procedure Laterality Date  . NO PAST SURGERIES     Patient Active Problem List   Diagnosis Date Noted  . Acute CVA (cerebrovascular accident) (HCC) 06/17/2018  . Left hemiparesis (HCC) 07/16/2015  . Accelerated hypertension 07/16/2015      Prior to Admission medications   Not on File    Allergies Patient has no known allergies.    Social History Social History   Tobacco Use  . Smoking status: Current Every Day Smoker    Types: Cigarettes  . Smokeless tobacco: Never Used  Substance Use Topics  . Alcohol use: No  . Drug use: No    Review of Systems Patient denies headaches, rhinorrhea, blurry vision, numbness, shortness of breath, chest pain, edema, cough, abdominal pain, nausea, vomiting, diarrhea, dysuria, fevers, rashes or hallucinations unless otherwise stated above in HPI. ____________________________________________   PHYSICAL EXAM:  VITAL SIGNS: Vitals:   06/17/18 2203 06/17/18 2219  BP: (!) 205/188   Pulse: 75   Resp: 16   Temp: 98.1 F (36.7 C) 98.5 F (36.9 C)  SpO2: 99%     Constitutional: Alert and  oriented. Well appearing and in no acute distress. Eyes: Conjunctivae are normal.  Head: Atraumatic. Nose: No congestion/rhinnorhea. Mouth/Throat: Mucous membranes are moist.   Neck: Painless ROM.  Cardiovascular:   Good peripheral circulation. Respiratory: Normal respiratory effort.  No retractions.  Gastrointestinal: Soft and nontender.  Musculoskeletal: No lower extremity tenderness .  No joint effusions. Neurologic:  Residual right sided weakness. No gross focal neurologic deficits are appreciated.  Skin:  Skin is warm, dry and intact. No rash noted. Psychiatric: Mood and affect are normal. Speech and behavior are normal.  ____________________________________________   LABS (all labs ordered are listed, but only abnormal results are displayed)  Results for orders placed or performed during the hospital encounter of 06/17/18 (from the past 24 hour(s))  CBC     Status: None   Collection Time: 06/17/18 10:19 AM  Result Value Ref Range   WBC 6.1 3.8 - 10.6 K/uL   RBC 4.80 4.40 - 5.90 MIL/uL   Hemoglobin 14.9 13.0 - 18.0 g/dL   HCT 16.1 09.6 - 04.5 %   MCV 91.2 80.0 - 100.0 fL   MCH 31.1 26.0 - 34.0 pg   MCHC 34.1 32.0 - 36.0 g/dL   RDW 40.9 81.1 - 91.4 %   Platelets 196 150 - 440 K/uL  Differential     Status: None   Collection Time: 06/17/18 10:19 AM  Result Value Ref Range   Neutrophils Relative % 66 %  Neutro Abs 4.1 1.4 - 6.5 K/uL   Lymphocytes Relative 23 %   Lymphs Abs 1.4 1.0 - 3.6 K/uL   Monocytes Relative 7 %   Monocytes Absolute 0.4 0.2 - 1.0 K/uL   Eosinophils Relative 4 %   Eosinophils Absolute 0.2 0 - 0.7 K/uL   Basophils Relative 0 %   Basophils Absolute 0.0 0 - 0.1 K/uL  Comprehensive metabolic panel     Status: Abnormal   Collection Time: 06/17/18 10:19 AM  Result Value Ref Range   Sodium 138 135 - 145 mmol/L   Potassium 3.9 3.5 - 5.1 mmol/L   Chloride 106 98 - 111 mmol/L   CO2 26 22 - 32 mmol/L   Glucose, Bld 97 70 - 99 mg/dL   BUN 10 6 - 20  mg/dL   Creatinine, Ser 9.98 (H) 0.61 - 1.24 mg/dL   Calcium 9.4 8.9 - 33.8 mg/dL   Total Protein 7.3 6.5 - 8.1 g/dL   Albumin 4.4 3.5 - 5.0 g/dL   AST 20 15 - 41 U/L   ALT 27 0 - 44 U/L   Alkaline Phosphatase 83 38 - 126 U/L   Total Bilirubin 0.9 0.3 - 1.2 mg/dL   GFR calc non Af Amer >60 >60 mL/min   GFR calc Af Amer >60 >60 mL/min   Anion gap 6 5 - 15  Troponin I     Status: None   Collection Time: 06/17/18 10:19 AM  Result Value Ref Range   Troponin I <0.03 <0.03 ng/mL  Glucose, capillary     Status: Abnormal   Collection Time: 06/17/18 10:19 AM  Result Value Ref Range   Glucose-Capillary 110 (H) 70 - 99 mg/dL  Lipid panel     Status: Abnormal   Collection Time: 06/17/18 10:19 AM  Result Value Ref Range   Cholesterol 177 0 - 200 mg/dL   Triglycerides 250 <539 mg/dL   HDL 33 (L) >76 mg/dL   Total CHOL/HDL Ratio 5.4 RATIO   VLDL 26 0 - 40 mg/dL   LDL Cholesterol 734 (H) 0 - 99 mg/dL  Glucose, capillary     Status: None   Collection Time: 06/17/18  3:45 PM  Result Value Ref Range   Glucose-Capillary 84 70 - 99 mg/dL  MRSA PCR Screening     Status: None   Collection Time: 06/17/18  5:32 PM  Result Value Ref Range   MRSA by PCR NEGATIVE NEGATIVE   ____________________________________________ ____________________________________________  RADIOLOGY   ____________________________________________   PROCEDURES  Procedure(s) performed:  Procedures    Critical Care performed: no ____________________________________________   INITIAL IMPRESSION / ASSESSMENT AND PLAN / ED COURSE  Pertinent labs & imaging results that were available during my care of the patient were reviewed by me and considered in my medical decision making (see chart for details).  DDX: cva, htn, dehydration  Tony Taylor is a 27 y.o. who presents to the ED with recent diagnosis of stroke re-presented to the ER now agreeable to admission.  Will discuss with hospitalist.       ____________________________________________   FINAL CLINICAL IMPRESSION(S) / ED DIAGNOSES  Final diagnoses:  Cerebrovascular accident (CVA), unspecified mechanism (HCC)      NEW MEDICATIONS STARTED DURING THIS VISIT:  New Prescriptions   No medications on file     Note:  This document was prepared using Dragon voice recognition software and may include unintentional dictation errors.     Willy Eddy, MD 06/17/18 2239

## 2018-06-17 NOTE — ED Notes (Addendum)
Admitting MD Patel at bedside 

## 2018-06-17 NOTE — Progress Notes (Signed)
*  PRELIMINARY RESULTS* Echocardiogram 2D Echocardiogram has been performed.  Tony Taylor 06/17/2018, 4:35 PM

## 2018-06-17 NOTE — ED Notes (Signed)
Pt taken to inpt room with [facility]. At time of leaving ED, pt alert & oriented x4. ABCs intact. NAD.

## 2018-06-17 NOTE — ED Notes (Addendum)
Patient ambulatory, states he thinks he is having a stroke.  Sx of right sided weakness began 48 hrs. Ago.  States he has had previous hx of stroke approx 4-5 years ago.  Crying, speech is clear.  Placed in WC.  Triage Nurse notified.

## 2018-06-17 NOTE — ED Notes (Signed)
Pt returned from MRI. ABCs intact. NAD. Cardene will be restarted

## 2018-06-17 NOTE — ED Notes (Signed)
Pt reports sxs of weakness, difficulty swallowing and difficulty pronouncing certain words for 2.5 days, pt states he has hx of prior stroke as well. Per pt he has not received any follow up for previous stroke and stopped taking his BP medications a while ago.

## 2018-06-18 ENCOUNTER — Observation Stay: Payer: Self-pay

## 2018-06-18 ENCOUNTER — Encounter: Payer: Self-pay | Admitting: Internal Medicine

## 2018-06-18 DIAGNOSIS — I639 Cerebral infarction, unspecified: Principal | ICD-10-CM

## 2018-06-18 LAB — LIPID PANEL
Cholesterol: 174 mg/dL (ref 0–200)
HDL: 39 mg/dL — ABNORMAL LOW (ref >40)
LDL Cholesterol: 122 mg/dL — ABNORMAL HIGH (ref 0–99)
Total CHOL/HDL Ratio: 4.5 RATIO
Triglycerides: 67 mg/dL (ref <150)
VLDL: 13 mg/dL (ref 0–40)

## 2018-06-18 LAB — HEMOGLOBIN A1C
Hgb A1c MFr Bld: 5.5 % (ref 4.8–5.6)
Mean Plasma Glucose: 111.15 mg/dL

## 2018-06-18 LAB — ANTITHROMBIN III: AntiThromb III Func: 120 % (ref 75–120)

## 2018-06-18 MED ORDER — HYDRALAZINE HCL 50 MG PO TABS
50.0000 mg | ORAL_TABLET | Freq: Three times a day (TID) | ORAL | Status: DC
  Administered 2018-06-18: 14:00:00 50 mg via ORAL
  Filled 2018-06-18: qty 1

## 2018-06-18 MED ORDER — ENOXAPARIN SODIUM 40 MG/0.4ML ~~LOC~~ SOLN
40.0000 mg | SUBCUTANEOUS | Status: DC
  Administered 2018-06-18 – 2018-06-20 (×3): 40 mg via SUBCUTANEOUS
  Filled 2018-06-18 (×3): qty 0.4

## 2018-06-18 MED ORDER — IOHEXOL 350 MG/ML SOLN
75.0000 mL | Freq: Once | INTRAVENOUS | Status: AC | PRN
  Administered 2018-06-18: 75 mL via INTRAVENOUS

## 2018-06-18 MED ORDER — ROSUVASTATIN CALCIUM 20 MG PO TABS
20.0000 mg | ORAL_TABLET | Freq: Every day | ORAL | Status: DC
  Administered 2018-06-18 – 2018-06-20 (×3): 20 mg via ORAL
  Filled 2018-06-18 (×3): qty 1

## 2018-06-18 MED ORDER — BACLOFEN 10 MG PO TABS
10.0000 mg | ORAL_TABLET | Freq: Three times a day (TID) | ORAL | Status: DC
  Administered 2018-06-18: 10 mg via ORAL
  Filled 2018-06-18 (×4): qty 1

## 2018-06-18 MED ORDER — NICOTINE 21 MG/24HR TD PT24
21.0000 mg | MEDICATED_PATCH | Freq: Every day | TRANSDERMAL | Status: DC
  Administered 2018-06-18 – 2018-06-20 (×3): 21 mg via TRANSDERMAL
  Filled 2018-06-18 (×3): qty 1

## 2018-06-18 MED ORDER — STROKE: EARLY STAGES OF RECOVERY BOOK
Freq: Once | Status: AC
  Administered 2018-06-18

## 2018-06-18 MED ORDER — HYDRALAZINE HCL 50 MG PO TABS
50.0000 mg | ORAL_TABLET | Freq: Once | ORAL | Status: AC
  Administered 2018-06-18: 17:00:00 50 mg via ORAL
  Filled 2018-06-18: qty 1

## 2018-06-18 MED ORDER — ACETAMINOPHEN 650 MG RE SUPP: 650.0000 mg | RECTAL | Status: DC | PRN

## 2018-06-18 MED ORDER — ACETAMINOPHEN 325 MG PO TABS: 650.0000 mg | ORAL_TABLET | ORAL | Status: DC | PRN

## 2018-06-18 MED ORDER — SODIUM CHLORIDE 0.9 % IV SOLN
INTRAVENOUS | Status: DC
  Administered 2018-06-18 (×2): via INTRAVENOUS

## 2018-06-18 MED ORDER — FAMOTIDINE 20 MG PO TABS
20.0000 mg | ORAL_TABLET | Freq: Two times a day (BID) | ORAL | Status: DC
  Administered 2018-06-18 – 2018-06-20 (×5): 20 mg via ORAL
  Filled 2018-06-18 (×5): qty 1

## 2018-06-18 MED ORDER — SENNOSIDES-DOCUSATE SODIUM 8.6-50 MG PO TABS: 1.0000 | ORAL_TABLET | Freq: Every evening | ORAL | Status: DC | PRN

## 2018-06-18 MED ORDER — HYDRALAZINE HCL 50 MG PO TABS
100.0000 mg | ORAL_TABLET | Freq: Three times a day (TID) | ORAL | Status: DC
  Administered 2018-06-18: 21:00:00 100 mg via ORAL
  Filled 2018-06-18: qty 2

## 2018-06-18 MED ORDER — SODIUM CHLORIDE 0.9% FLUSH
3.0000 mL | Freq: Two times a day (BID) | INTRAVENOUS | Status: DC
  Administered 2018-06-18 – 2018-06-20 (×12): 3 mL via INTRAVENOUS

## 2018-06-18 MED ORDER — CHLORPROMAZINE HCL 25 MG PO TABS
25.0000 mg | ORAL_TABLET | Freq: Three times a day (TID) | ORAL | Status: DC | PRN
  Administered 2018-06-18 – 2018-06-20 (×6): 25 mg via ORAL
  Filled 2018-06-18 (×7): qty 1

## 2018-06-18 MED ORDER — ONDANSETRON HCL 4 MG/2ML IJ SOLN
4.0000 mg | Freq: Four times a day (QID) | INTRAMUSCULAR | Status: DC | PRN
  Administered 2018-06-18 – 2018-06-20 (×4): 4 mg via INTRAVENOUS
  Filled 2018-06-18 (×5): qty 2

## 2018-06-18 MED ORDER — HYDRALAZINE HCL 20 MG/ML IJ SOLN
10.0000 mg | INTRAMUSCULAR | Status: DC | PRN
  Administered 2018-06-18 – 2018-06-19 (×5): 10 mg via INTRAVENOUS
  Filled 2018-06-18 (×5): qty 1

## 2018-06-18 MED ORDER — ALUM & MAG HYDROXIDE-SIMETH 200-200-20 MG/5ML PO SUSP
30.0000 mL | ORAL | Status: DC | PRN
  Administered 2018-06-18 – 2018-06-20 (×7): 30 mL via ORAL
  Filled 2018-06-18 (×6): qty 30

## 2018-06-18 MED ORDER — ALUM & MAG HYDROXIDE-SIMETH 200-200-20 MG/5ML PO SUSP
30.0000 mL | Freq: Four times a day (QID) | ORAL | Status: DC | PRN
  Filled 2018-06-18: qty 30

## 2018-06-18 MED ORDER — ACETAMINOPHEN 160 MG/5ML PO SOLN: 650.0000 mg | ORAL | Status: DC | PRN

## 2018-06-18 MED ORDER — HYDROCHLOROTHIAZIDE 25 MG PO TABS
25.0000 mg | ORAL_TABLET | Freq: Every day | ORAL | Status: DC
  Administered 2018-06-18 – 2018-06-19 (×2): 25 mg via ORAL
  Filled 2018-06-18 (×2): qty 1

## 2018-06-18 MED ORDER — ZOLPIDEM TARTRATE 5 MG PO TABS: 5.0000 mg | ORAL_TABLET | Freq: Every evening | ORAL | Status: DC | PRN

## 2018-06-18 MED ORDER — TRAZODONE HCL 50 MG PO TABS
50.0000 mg | ORAL_TABLET | Freq: Every evening | ORAL | Status: DC | PRN
  Administered 2018-06-18 – 2018-06-19 (×2): 50 mg via ORAL
  Filled 2018-06-18 (×2): qty 1

## 2018-06-18 MED ORDER — ASPIRIN 325 MG PO TABS
325.0000 mg | ORAL_TABLET | Freq: Every day | ORAL | Status: DC
  Administered 2018-06-18 – 2018-06-20 (×3): 325 mg via ORAL
  Filled 2018-06-18 (×4): qty 1

## 2018-06-18 NOTE — Progress Notes (Addendum)
[facility] [place] [facility]   PATIENT NAME: Tony Taylor    MR#:  [id number]  DATE OF BIRTH:  10-26-1990  SUBJECTIVE:   Patient reports he had a stroke 3 years ago.  Has not been on any medications for his blood pressure since that time.  Patient is not taking aspirin daily.  He continues to smoke and would like a nicotine patch. He feels that his right side is weaker and his speech is still slurred  REVIEW OF SYSTEMS:    Review of Systems  Constitutional: Negative for fever, chills weight loss HENT: Negative for ear pain, nosebleeds, congestion, facial swelling, rhinorrhea, neck pain, neck stiffness and ear discharge.   Respiratory: Negative for cough, shortness of breath, wheezing  Cardiovascular: Negative for chest pain, palpitations and leg swelling.  Gastrointestinal: Negative for heartburn, abdominal pain, vomiting, diarrhea or consitpation Genitourinary: Negative for dysuria, urgency, frequency, hematuria Musculoskeletal: Negative for back pain or joint pain Neurological: Positive for right lower extremity weakness with slurred speech at times and confusion  hematological: Does not bruise/bleed easily.  Psychiatric/Behavioral: Negative for hallucinations, confusion, dysphoric mood    Tolerating Diet: yes      DRUG ALLERGIES:  No Known Allergies  VITALS:  Blood pressure (!) 187/113, pulse 61, temperature 99.5 F (37.5 C), temperature source Oral, resp. rate 20, height 6\' 5"  (1.956 m), weight 124.7 kg, SpO2 99 %.  PHYSICAL EXAMINATION:  Constitutional: Appears well-developed and well-nourished. No distress. HENT: Normocephalic. Marland Kitchen Oropharynx is clear and moist.  Eyes: Conjunctivae and EOM are normal. PERRLA, no scleral icterus.  Neck: Normal ROM. Neck supple. No JVD. No tracheal deviation. CVS: RRR, S1/S2 +, no murmurs, no gallops, no carotid bruit.  Pulmonary: Effort and breath sounds normal, no stridor, rhonchi, wheezes, rales.   Abdominal: Soft. BS +,  no distension, tenderness, rebound or guarding.  Musculoskeletal: Normal range of motion. No edema and no tenderness.  Neuro: Alert. CN 2-12 grossly intact.  Patient speech is slow but not slurred his right lower extremity is 4 out of 5 strength Skin: Skin is warm and dry. No rash noted. Psychiatric: Normal mood and affect.      LABORATORY PANEL:   CBC Recent Labs  Lab 06/17/18 1019  WBC 6.1  HGB 14.9  HCT 43.8  PLT 196   ------------------------------------------------------------------------------------------------------------------  Chemistries  Recent Labs  Lab 06/17/18 1019  NA 138  K 3.9  CL 106  CO2 26  GLUCOSE 97  BUN 10  CREATININE 1.31*  CALCIUM 9.4  AST 20  ALT 27  ALKPHOS 83  BILITOT 0.9   ------------------------------------------------------------------------------------------------------------------  Cardiac Enzymes Recent Labs  Lab 06/17/18 1019  TROPONINI <0.03   ------------------------------------------------------------------------------------------------------------------  RADIOLOGY:  Ct Head Wo Contrast  Result Date: 06/17/2018 CLINICAL DATA:  Right-sided weakness and dysphagia. History of right-sided pontine infarct. EXAM: CT HEAD WITHOUT CONTRAST TECHNIQUE: Contiguous axial images were obtained from the base of the skull through the vertex without intravenous contrast. COMPARISON:  Head CT July 16, 2015; brain MRI July 17, 2015 FINDINGS: Brain: The ventricles are normal in size and configuration. There is no evident intracranial mass, hemorrhage, extra-axial fluid collection, or midline shift. Decreased attenuation in the right lower pons is noted, consistent with prior documented infarct in this area. No new gray-white compartment lesions are evident. No acute infarct is evident by CT on this study. Vascular: No hyperdense vessel. There is no appreciable vascular calcification. Skull: The bony calvarium appears  intact. Sinuses/Orbits: Visualized paranasal sinuses  are clear. Visualized orbits appear symmetric bilaterally. Other: Mastoid air cells are clear. IMPRESSION: Decreased attenuation in the lower right pons at the site of prior infarct. Elsewhere gray-white compartments appear normal. No acute infarct evident. No mass or hemorrhage. Study otherwise unremarkable. Electronically Signed   By: Bretta Bang III M.D.   On: 06/17/2018 10:27   Mr Brain Wo Contrast  Result Date: 06/17/2018 CLINICAL DATA:  Abnormal speech.  History of stroke EXAM: MRI HEAD WITHOUT CONTRAST TECHNIQUE: Multiplanar, multiecho pulse sequences of the brain and surrounding structures were obtained without intravenous contrast. COMPARISON:  CT head 06/17/2018.  MRI 07/17/2015 FINDINGS: Brain: Acute infarct in the left pons. 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 Acute infarct in the posterior external capsule on the right. Scattered small white matter hyperintensities bilaterally consistent with chronic ischemia, mild. Ventricle size normal. Negative for hemorrhage or mass. No midline shift. Vascular: Normal arterial flow void Skull and upper cervical spine: Negative Sinuses/Orbits: Negative Other: None IMPRESSION: Acute infarct in the left pons. Acute infarct in the right posterior external capsule Relatively large chronic infarct in the right pons Electronically Signed   By: Marlan Palau M.D.   On: 06/17/2018 13:01     ASSESSMENT AND PLAN:   27 year old male with history of CVA, hypertension and tobacco dependence who is not on any medications who presents to the emergency room initially due to hiccups and right-sided weakness and apparently presented earlier to the emergency room but left AMA.   1.Acute infarct in the left pons. Acute infarct in the right posterior external capsule Relatively large chronic infarct in the right pons: Follow-up on CVA work-up including echo and CTA head/neck Start aspirin and statin LDL  is 122 A1c 5.5 Follow-up on neurology evaluation, PT, OT and speech evaluation I ordered hypercoagulable work-up Further management as per neurology.   2.  Accelerated essential hypertension: Blood pressure is still very high Increase hydralazine to 50 mg 3 times daily Continue Norvasc and add HCTZ 25 mg daily Goal blood pressure less than 140/90 NEPHROLOGY consult placed  3. Tobacco dependence: Patient is encouraged to quit smoking. Counseling was provided for 4 minutes.    Management plans discussed with the patient and he is in agreement.  CODE STATUS: Full  TOTAL TIME TAKING CARE OF THIS PATIENT: 30 minutes.     POSSIBLE D/C tomorrow, DEPENDING ON CLINICAL CONDITION.   Demario Faniel M.D on 06/18/2018 at 9:22 AM  Between 7am to 6pm - Pager - [phone] After 6pm go to [email or web address] - password [email or web address]  [facility]  Office  [phone]  CC: Primary care physician; Patient, No Pcp Per  Note: This dictation was prepared with Dragon dictation along with smaller phrase technology. Any transcriptional errors that result from this process are unintentional.

## 2018-06-18 NOTE — Progress Notes (Signed)
Dr Juliene Pina paged x 2 and texted x 1. Waiting for reponse.

## 2018-06-18 NOTE — Progress Notes (Signed)
Notified Dr Juliene Pina of patients blood pressure 209/117 with all prn meds given and BP being consistently high all day. MD ordered. 50 mg hydralazine once and to modify hydralazine order to 100 mg every 8 hours.

## 2018-06-18 NOTE — Progress Notes (Signed)
Persistent hiccoughs noted.  Pt remains anxious and thrashing in bed.  B/P elevated.  No further changes in neuro status noted.  Calming measures attempted and unsuccessful. MD made aware via text page.  Prn meds given as ordered.

## 2018-06-18 NOTE — Evaluation (Signed)
Physical Therapy Evaluation Patient Details Name: Tony Taylor MRN: [id number] DOB: [birth year] Today's Date: 06/18/2018   History of Present Illness  27 year old male with history of CVA, hypertension and tobacco dependence who is not on any medications who presented to the emergency room initially due to hiccups and right-sided weakness, MRI revealed new L side CVA (h/o R pons infarct 3 years ago) he left AMA and his brother encouraged him to come back as symptoms have persisted.  Clinical Impression  Pt was able to fully participate with PT exam and though he had some weakness in R U&LEs he was still [facility] bilaterally.  His biggest PT issue was consistent cadence, foot placement and having some occasional buckling/quality of motion issues with the R LE.  Overall he should be safe to go home with the assist that is available from family, outpt PT would benefit with coordination/quality of motion/balance.       Follow Up Recommendations Outpatient PT    Equipment Recommendations  None recommended by PT    Recommendations for Other Services       Precautions / Restrictions Precautions Precautions: (moderate fall) Restrictions Weight Bearing Restrictions: No      Mobility  Bed Mobility Overal bed mobility: Independent             General bed mobility comments: Pt able to get himself to sitting EOB w/o assist, minimal effort  Transfers Overall transfer level: Independent Equipment used: None             General transfer comment: Pt able to rise to standing wo direct assist, showed good confidence though there was some minimal unsteadiness  Ambulation/Gait Ambulation/Gait assistance: Supervision Gait Distance (Feet): 250 Feet Assistive device: None       General Gait Details: Pt was able to walk with good speed, but had inconsistent cadence and some issues with R LE coordination, foot placement, minimal buckling and generally reports feeling considerably different  from his baseline.   Stairs Stairs: Yes Stairs assistance: Min guard Stair Management: No rails Number of Stairs: 4 General stair comments: Pt was able to negotiate up/down steps w/o UEs, but needed close supervision and cuing to insure safety, he was more confident than his phyiscal ability warrented.  Wheelchair Mobility    Modified Rankin (Stroke Patients Only)       Balance Overall balance assessment: Modified Independent(Pt with no overt falls/LOBs, but was unsteady/inconsistent)                                           Pertinent Vitals/Pain Pain Assessment: No/denies pain    Home Living Family/patient expects to be discharged to:: Private residence Living Arrangements: Parent Available Help at Discharge: Family;Available 24 hours/day   Home Access: Stairs to enter Entrance Stairs-Rails: None Entrance Stairs-Number of Steps: 4          Prior Function Level of Independence: Independent         Comments: Pt works 2 jobs and is one his feet most of the day.  Reports he essentially had full recovery from CVA 3 years ago     Hand Dominance        Extremity/Trunk Assessment   Upper Extremity Assessment Upper Extremity Assessment: Overall WFL for tasks assessed(L UE > R re: strength/quality of motion - functional b/l)    Lower Extremity Assessment Lower Extremity Assessment: Overall [facility]  for tasks assessed(L UE > R re: strength/quality of motion - functional b/l)       Communication   Communication: No difficulties(occasionally struggles to articulate words, mostly WNL)  Cognition Arousal/Alertness: Awake/alert Behavior During Therapy: Anxious Overall Cognitive Status: Within Functional Limits for tasks assessed                                        General Comments      Exercises     Assessment/Plan    PT Assessment Patient needs continued PT services  PT Problem List Decreased strength;Decreased activity  tolerance;Decreased balance;Decreased coordination;Decreased safety awareness       PT Treatment Interventions Gait training;Stair training;Functional mobility training;Therapeutic activities;Therapeutic exercise;Balance training;Neuromuscular re-education;Patient/family education    PT Goals (Current goals can be found in the Care Plan section)  Acute Rehab PT Goals Patient Stated Goal: get back where he can sing, go back to work, return to his normal PT Goal Formulation: With patient Time For Goal Achievement: 07/02/18 Potential to Achieve Goals: Good    Frequency 7X/week   Barriers to discharge        Co-evaluation               AM-PAC PT "6 Clicks" Daily Activity  Outcome Measure Difficulty turning over in bed (including adjusting bedclothes, sheets and blankets)?: None Difficulty moving from lying on back to sitting on the side of the bed? : None Difficulty sitting down on and standing up from a chair with arms (e.g., wheelchair, bedside commode, etc,.)?: None Help needed moving to and from a bed to chair (including a wheelchair)?: None Help needed walking in hospital room?: None Help needed climbing 3-5 steps with a railing? : A Little 6 Click Score: 23    End of Session Equipment Utilized During Treatment: Gait belt Activity Tolerance: Patient tolerated treatment well Patient left: with call bell/phone within reach;in bed;with family/visitor present   PT Visit Diagnosis: Ataxic gait (R26.0);Other symptoms and signs involving the nervous system (R60.454)    Time: 0981-1914 PT Time Calculation (min) (ACUTE ONLY): 19 min   Charges:   PT Evaluation $PT Eval Low Complexity: 1 Low          Malachi Pro, DPT 06/18/2018, 10:32 AM

## 2018-06-18 NOTE — Care Management Note (Signed)
Case Management Note  Patient Details  Name: Tony Taylor MRN: [id number] Date of Birth: 03/01/1991  Subjective/Objective:    Admitted to [facility] with the diagnosis of stroke. Lives with father. Grandmother is Elie Confer [phone]).  No primary care physician since he was 27 years old. No prescriptions.  States he just starting working at [employer] and [employer]. A resident of [place]. Never been to [facility]               Action/Plan: Will ask Feliberto Gottron, [facility] representative about [facility] Referral to [facility] and Medication Management  Expected Discharge Date:                  Expected Discharge Plan:     In-House Referral:   yes  Discharge planning Services     Post Acute Care Choice:    Choice offered to:     DME Arranged:    DME Agency:     HH Arranged:   yes HH Agency:   [facility] [employer].  Status of Service:     If discussed at Long Length of Stay Meetings, dates discussed:    Additional Comments:  [name], RN MSN CCM Care Management [phone] 06/18/2018, 10:30 AM

## 2018-06-18 NOTE — Progress Notes (Signed)
OT Cancellation Note  Patient Details Name: Tony Taylor MRN: [id number] DOB: 08-01-1991   Cancelled Treatment:    Reason Eval/Treat Not Completed: Medical issues which prohibited therapy(Most recent BP 187/113. Will continue to monitor, and perform initial OT eval when medically appropriate.)  Olegario Messier, MS, OTR/L 06/18/2018, 8:57 AM

## 2018-06-18 NOTE — Progress Notes (Signed)
Dr Juliene Pina notified that patient had blood pressure of 205/116. MD acknowledged. Ordered to give prn labetol in 1 hour if no improvement.

## 2018-06-18 NOTE — H&P (Signed)
Tony Taylor is an 27 y.o. male.   Chief Complaint: Hiccups HPI: The patient with past medical history of hypertension and previous stroke presents to the emergency department less than 2 hours after leaving AMA following admission for stroke. The patient had been having right side weakness for at least 3 days. He also admits to difficulty swallowing during this time, as well as worsening acid reflux. The latter is what frustrated him to the point of leaving the hospital.  Please see H&P dated earlier today for further details. In the emergency department the patient displayed no new focal deficits but complained of severe hiccups. Once he agreed to re-admission, the hospitalist placed orders for further evaluation.   Past Medical History:  Diagnosis Date  . CVA (cerebral infarction)   . Hypertension     Past Surgical History:  Procedure Laterality Date  . NO PAST SURGERIES      Family History  Problem Relation Age of Onset  . Stroke Mother   . Hypertension Father    Social History:  reports that he has been smoking cigarettes. He has never used smokeless tobacco. He reports that he does not drink alcohol or use drugs.  Allergies: No Known Allergies  No medications prior to admission.    Results for orders placed or performed during the hospital encounter of 06/17/18 (from the past 48 hour(s))  CBC     Status: None   Collection Time: 06/17/18 10:19 AM  Result Value Ref Range   WBC 6.1 3.8 - 10.6 K/uL   RBC 4.80 4.40 - 5.90 MIL/uL   Hemoglobin 14.9 13.0 - 18.0 g/dL   HCT 43.8 40.0 - 52.0 %   MCV 91.2 80.0 - 100.0 fL   MCH 31.1 26.0 - 34.0 pg   MCHC 34.1 32.0 - 36.0 g/dL   RDW 12.9 11.5 - 14.5 %   Platelets 196 150 - 440 K/uL    Comment: Performed at [facility], [address] [place], [place] [address]  Differential     Status: None   Collection Time: 06/17/18 10:19 AM  Result Value Ref Range   Neutrophils Relative % 66 %   Neutro Abs 4.1 1.4 - 6.5 K/uL    Lymphocytes Relative 23 %   Lymphs Abs 1.4 1.0 - 3.6 K/uL   Monocytes Relative 7 %   Monocytes Absolute 0.4 0.2 - 1.0 K/uL   Eosinophils Relative 4 %   Eosinophils Absolute 0.2 0 - 0.7 K/uL   Basophils Relative 0 %   Basophils Absolute 0.0 0 - 0.1 K/uL    Comment: Performed at [facility], [address] [place], [place] [address]  Comprehensive metabolic panel     Status: Abnormal   Collection Time: 06/17/18 10:19 AM  Result Value Ref Range   Sodium 138 135 - 145 mmol/L   Potassium 3.9 3.5 - 5.1 mmol/L   Chloride 106 98 - 111 mmol/L   CO2 26 22 - 32 mmol/L   Glucose, Bld 97 70 - 99 mg/dL   BUN 10 6 - 20 mg/dL   Creatinine, Ser 1.31 (H) 0.61 - 1.24 mg/dL   Calcium 9.4 8.9 - 10.3 mg/dL   Total Protein 7.3 6.5 - 8.1 g/dL   Albumin 4.4 3.5 - 5.0 g/dL   AST 20 15 - 41 U/L   ALT 27 0 - 44 U/L   Alkaline Phosphatase 83 38 - 126 U/L   Total Bilirubin 0.9 0.3 - 1.2 mg/dL  GFR calc non Af Amer >60 >60 mL/min   GFR calc Af Amer >60 >60 mL/min    Comment: (NOTE) The eGFR has been calculated using the CKD EPI equation. This calculation has not been validated in all clinical situations. eGFR's persistently <60 mL/min signify possible Chronic Kidney Disease.    Anion gap 6 5 - 15    Comment: Performed at [facility], [address] [place], [place] [address]  Troponin I     Status: None   Collection Time: 06/17/18 10:19 AM  Result Value Ref Range   Troponin I <0.03 <0.03 ng/mL    Comment: Performed at [facility], [address] [place], [place] [address]  Glucose, capillary     Status: Abnormal   Collection Time: 06/17/18 10:19 AM  Result Value Ref Range   Glucose-Capillary 110 (H) 70 - 99 mg/dL  Lipid panel     Status: Abnormal   Collection Time: 06/17/18 10:19 AM  Result Value Ref Range   Cholesterol 177 0 - 200 mg/dL   Triglycerides 131 <150 mg/dL   HDL 33 (L) >40 mg/dL   Total CHOL/HDL Ratio 5.4 RATIO   VLDL 26 0 - 40 mg/dL   LDL  Cholesterol 118 (H) 0 - 99 mg/dL    Comment:        Total Cholesterol/HDL:CHD Risk Coronary Heart Disease Risk Table                     Men   Women  1/2 Average Risk   3.4   3.3  Average Risk       5.0   4.4  2 X Average Risk   9.6   7.1  3 X Average Risk  23.4   11.0        Use the calculated Patient Ratio above and the CHD Risk Table to determine the patient's CHD Risk.        ATP III CLASSIFICATION (LDL):  <100     mg/dL   Optimal  100-129  mg/dL   Near or Above                    Optimal  130-159  mg/dL   Borderline  160-189  mg/dL   High  >190     mg/dL   Very High Performed at [facility], [address] [place], [place] [address]   Glucose, capillary     Status: None   Collection Time: 06/17/18  3:45 PM  Result Value Ref Range   Glucose-Capillary 84 70 - 99 mg/dL  MRSA PCR Screening     Status: None   Collection Time: 06/17/18  5:32 PM  Result Value Ref Range   MRSA by PCR NEGATIVE NEGATIVE    Comment:        The GeneXpert MRSA Assay (FDA approved for NASAL specimens only), is one component of a comprehensive MRSA colonization surveillance program. It is not intended to diagnose MRSA infection nor to guide or monitor treatment for MRSA infections. Performed at [facility], [address] [place], [place] [address]    Ct Head Wo Contrast  Result Date: 06/17/2018 CLINICAL DATA:  Right-sided weakness and dysphagia. History of right-sided pontine infarct. EXAM: CT HEAD WITHOUT CONTRAST TECHNIQUE: Contiguous axial images were obtained from the base of the skull through the vertex without intravenous contrast. COMPARISON:  Head CT July 16, 2015; brain MRI July 17, 2015 FINDINGS: Brain: The ventricles are  normal in size and configuration. There is no evident intracranial mass, hemorrhage, extra-axial fluid collection, or midline shift. Decreased attenuation in the right lower pons is noted, consistent with prior documented infarct in this  area. No new gray-white compartment lesions are evident. No acute infarct is evident by CT on this study. Vascular: No hyperdense vessel. There is no appreciable vascular calcification. Skull: The bony calvarium appears intact. Sinuses/Orbits: Visualized paranasal sinuses are clear. Visualized orbits appear symmetric bilaterally. Other: Mastoid air cells are clear. IMPRESSION: Decreased attenuation in the lower right pons at the site of prior infarct. Elsewhere gray-white compartments appear normal. No acute infarct evident. No mass or hemorrhage. Study otherwise unremarkable. Electronically Signed   By: Lowella Grip III M.D.   On: 06/17/2018 10:27   Mr Brain Wo Contrast  Result Date: 06/17/2018 CLINICAL DATA:  Abnormal speech.  History of stroke EXAM: MRI HEAD WITHOUT CONTRAST TECHNIQUE: Multiplanar, multiecho pulse sequences of the brain and surrounding structures were obtained without intravenous contrast. COMPARISON:  CT head 06/17/2018.  MRI 07/17/2015 FINDINGS: Brain: Acute infarct in the left pons. 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 Acute infarct in the posterior external capsule on the right. Scattered small white matter hyperintensities bilaterally consistent with chronic ischemia, mild. Ventricle size normal. Negative for hemorrhage or mass. No midline shift. Vascular: Normal arterial flow void Skull and upper cervical spine: Negative Sinuses/Orbits: Negative Other: None IMPRESSION: Acute infarct in the left pons. Acute infarct in the right posterior external capsule Relatively large chronic infarct in the right pons Electronically Signed   By: Franchot Gallo M.D.   On: 06/17/2018 13:01    Review of Systems  Constitutional: Negative for chills and fever.  HENT: Negative for sore throat and tinnitus.   Eyes: Negative for blurred vision and redness.  Respiratory: Negative for cough and shortness of breath.   Cardiovascular: Negative for chest pain, palpitations, orthopnea and PND.   Gastrointestinal: Positive for heartburn and nausea. Negative for abdominal pain, diarrhea and vomiting.  Genitourinary: Negative for dysuria, frequency and urgency.  Musculoskeletal: Negative for joint pain and myalgias.  Skin: Negative for rash.       No lesions  Neurological: Positive for focal weakness. Negative for speech change and weakness.  Endo/Heme/Allergies: Does not bruise/bleed easily.       No temperature intolerance  Psychiatric/Behavioral: Negative for depression and suicidal ideas.    Blood pressure (!) 196/129, pulse 73, temperature 98.4 F (36.9 C), temperature source Oral, resp. rate 17, height 6' 5" (1.956 m), weight 124.7 kg, SpO2 100 %. Physical Exam  Vitals reviewed. Constitutional: He is oriented to person, place, and time. He appears well-developed and well-nourished. No distress.  HENT:  Head: Normocephalic and atraumatic.  Mouth/Throat: Oropharynx is clear and moist.  Eyes: Pupils are equal, round, and reactive to light. Conjunctivae and EOM are normal. No scleral icterus.  Neck: Normal range of motion. Neck supple. No JVD present. No tracheal deviation present. No thyromegaly present.  Cardiovascular: Normal rate and regular rhythm. Exam reveals no gallop and no friction rub.  No murmur heard. Respiratory: Breath sounds normal. No respiratory distress. He has no wheezes.  GI: Soft. Bowel sounds are normal. He exhibits no distension. There is no tenderness.  Genitourinary:  Genitourinary Comments: Deferred  Musculoskeletal: Normal range of motion. He exhibits no edema.  Neurological: He is alert and oriented to person, place, and time. No cranial nerve deficit.  Skin: Skin is warm and dry. No rash noted. No erythema.  Psychiatric:  He has a normal mood and affect. His behavior is normal. Judgment and thought content normal.     Assessment/Plan This is a 27 year old male admitted for stroke. 1.  Stroke: Right posterior external capsule as well as left  pons.  Likely secondary to uncontrolled hypertension.  However consider hypercoagulable work-up as well.  Obtain carotid ultrasounds.  Echocardiogram and MRI completed.  Consult neurology 2.  Hypertension: Uncontrolled; malignant.  The patient is outside of the 24-hour permissive hypertension phase.  I have started hydralazine 50 mg 3 times daily as well as amlodipine 10 mg.  Labetalol as needed. 3.  Obesity: BMI is 32.6; encourage healthy diet and exercise 4.  DVT prophylaxis: Lovenox 5.  GI prophylaxis: Pepcid as well as Thorazine for intractable hiccups (possibly secondary to pontine stroke and/or reflux) The patient is a full code.  Time spent on admission orders and patient care approximately 45 minutes  Harrie Foreman, MD 06/18/2018, 2:15 AM

## 2018-06-18 NOTE — Consult Note (Addendum)
Referring Physician: Arnaldo Natal, MD    Chief Complaint: Right side weakness  HPI: Tony Taylor is an 27 y.o. male with prior history of large infarct in the right pons and uncontrolled hypertension presenting to the ED on 06/17/2018 with 3 days of right side weakness and word finding difficulties. He also endorses tremors, trouble swallowing and new onset intractable hiccups. He report that he has had similar presentation but on the left side 3 years and was diagnosed with a stroke. Denies other associated altered sensorium, cranial nerve deficit, seizures, focal motor or sensory deficits, diplopia, or vomiting, syncope or LOC, paresthesia (numbness, tingling, pins-and-needles sensation) or a heavy feeling in any other extremity.  He had stroke work up including CT head and MRI brain which showed acute infarct in the left pons and right posterior external capsule. He left AMA following admission yesterday for stroke due to frustration of worsening acid reflux and hiccups but returned 2 hours later.   Date last known well: Date: 06/14/2018 Time last known well: Unable to determine tPA Given: No: Outside window period  Past Medical History:  Diagnosis Date  . CVA (cerebral infarction)   . Hypertension     Past Surgical History:  Procedure Laterality Date  . NO PAST SURGERIES      Family History  Problem Relation Age of Onset  . Stroke Mother   . Hypertension Father    Social History:  reports that he has been smoking cigarettes. He has never used smokeless tobacco. He reports that he does not drink alcohol or use drugs.  Allergies: No Known Allergies  Medications:  I have reviewed the patient's current medications. Prior to Admission:  No medications prior to admission.   Scheduled: . amLODipine  10 mg Oral Daily  . aspirin  325 mg Oral Daily  . enoxaparin (LOVENOX) injection  40 mg Subcutaneous Q24H  . hydrALAZINE      . hydrALAZINE  50 mg Oral Q8H  .  hydrochlorothiazide  25 mg Oral Daily  . nicotine  21 mg Transdermal Daily  . rosuvastatin  20 mg Oral Daily    ROS: History obtained from the patient   General ROS: negative for - chills, fatigue, fever, night sweats, weight gain or weight loss Psychological ROS: negative for - behavioral disorder, hallucinations, memory difficulties, mood swings or suicidal ideation Ophthalmic ROS: negative for - blurry vision, double vision, eye pain or loss of vision ENT ROS: negative for - epistaxis, nasal discharge, oral lesions, sore throat, tinnitus or vertigo Allergy and Immunology ROS: negative for - hives or itchy/watery eyes Hematological and Lymphatic ROS: negative for - bleeding problems, bruising or swollen lymph nodes Endocrine ROS: negative for - galactorrhea, hair pattern changes, polydipsia/polyuria or temperature intolerance Respiratory ROS: negative for - cough, hemoptysis, shortness of breath or wheezing Cardiovascular ROS: negative for - chest pain, dyspnea on exertion, edema or irregular heartbeat Gastrointestinal ROS: negative for - abdominal pain, diarrhea, hematemesis, nausea/vomiting or stool incontinence. Positive for acid reflux Genito-Urinary ROS: negative for - dysuria, hematuria, incontinence or urinary frequency/urgency Musculoskeletal ROS: negative for - joint swelling or muscular weakness Neurological ROS: as noted in HPI Dermatological ROS: negative for rash and skin lesion changes  Physical Examination: Blood pressure (!) 187/114, pulse 87, temperature 99.5 F (37.5 C), temperature source Oral, resp. rate 20, height 6\' 5"  (1.956 m), weight 124.7 kg, SpO2 98 %.  ? Neurological Exam  Mental Status: Alert, Oriented to time, place, person and situation Attention span  and concentration seemed appropriate Memory seemed OK. Intact naming, repetition, comprehension.  Followed 2 step commands   Cranial Nerves: I. Olfactory not examined II: Visual fields were full.  Pupils were equal, round and reactive to light and accommodation III,IV, VI: ptosis not present, extra-ocular motions intact bilaterally V,VII: smile symmetric, facial light touch sensation normal bilaterally VIII: Finger rub was heard symmetric in both ears IX, X: Palate and uvular movements are normal and oral sensations are OK, gag reflex deffered XI: Neck muscle strength and shoulder shrug is normal XII: midline tongue extension  Motor Exam: Tone is normal in all extremities Muscle strength in all extremities is 5/5. No abnormal movements, fasciculations or atrophy seen  Deep Tendon Reflexes: Right Biceps is 2+, Left Biceps is 2+ Right Triceps is 2+, Left Triceps is 2+ Right Brachioradialis is 2+, Left Brachioradialis is 2+ Right Knee Jerk is 2+, Left Knee Jerk is 2+ Right Ankle Jerk is 2+, Left Ankle Jerk is 2+ Right Toes are down going, Left Toes are down going  Sensory Exam: Sensations were intact to light touch in all extremities Vibration and proprioception are also intact  Co-ordination: Finger to nose is normal  Gait: Gait and station are normal when examined in small exam room Romberg's test is negative  Data Reviewed  Laboratory Studies:  Basic Metabolic Panel: Recent Labs  Lab 06/17/18 1019  NA 138  K 3.9  CL 106  CO2 26  GLUCOSE 97  BUN 10  CREATININE 1.31*  CALCIUM 9.4    Liver Function Tests: Recent Labs  Lab 06/17/18 1019  AST 20  ALT 27  ALKPHOS 83  BILITOT 0.9  PROT 7.3  ALBUMIN 4.4   No results for input(s): LIPASE, AMYLASE in the last 168 hours. No results for input(s): AMMONIA in the last 168 hours.  CBC: Recent Labs  Lab 06/17/18 1019  WBC 6.1  NEUTROABS 4.1  HGB 14.9  HCT 43.8  MCV 91.2  PLT 196    Cardiac Enzymes: Recent Labs  Lab 06/17/18 1019  TROPONINI <0.03    BNP: Invalid input(s): POCBNP  CBG: Recent Labs  Lab 06/17/18 1019 06/17/18 1545  GLUCAP 110* 84    Microbiology: Results for orders  placed or performed during the hospital encounter of 06/17/18  MRSA PCR Screening     Status: None   Collection Time: 06/17/18  5:32 PM  Result Value Ref Range Status   MRSA by PCR NEGATIVE NEGATIVE Final    Comment:        The GeneXpert MRSA Assay (FDA approved for NASAL specimens only), is one component of a comprehensive MRSA colonization surveillance program. It is not intended to diagnose MRSA infection nor to guide or monitor treatment for MRSA infections. Performed at [facility], [address] [place], [place] [address]     Coagulation Studies: No results for input(s): LABPROT, INR in the last 72 hours.  Urinalysis: No results for input(s): COLORURINE, LABSPEC, PHURINE, GLUCOSEU, HGBUR, BILIRUBINUR, KETONESUR, PROTEINUR, UROBILINOGEN, NITRITE, LEUKOCYTESUR in the last 168 hours.  Invalid input(s): APPERANCEUR  Lipid Panel:    Component Value Date/Time   CHOL 174 06/18/2018 0358   TRIG 67 06/18/2018 0358   HDL 39 (L) 06/18/2018 0358   CHOLHDL 4.5 06/18/2018 0358   VLDL 13 06/18/2018 0358   LDLCALC 122 (H) 06/18/2018 0358    HgbA1C:  Lab Results  Component Value Date   HGBA1C 5.4 07/17/2015    Urine Drug Screen:  No results found for: LABOPIA,  COCAINSCRNUR, LABBENZ, AMPHETMU, THCU, LABBARB  Alcohol Level: No results for input(s): ETH in the last 168 hours.  Other results: EKG: 57 bpm, LVH.  Imaging: Ct Head Wo Contrast  Result Date: 06/17/2018 CLINICAL DATA:  Right-sided weakness and dysphagia. History of right-sided pontine infarct. EXAM: CT HEAD WITHOUT CONTRAST TECHNIQUE: Contiguous axial images were obtained from the base of the skull through the vertex without intravenous contrast. COMPARISON:  Head CT July 16, 2015; brain MRI July 17, 2015 FINDINGS: Brain: The ventricles are normal in size and configuration. There is no evident intracranial mass, hemorrhage, extra-axial fluid collection, or midline shift. Decreased attenuation in the  right lower pons is noted, consistent with prior documented infarct in this area. No new gray-white compartment lesions are evident. No acute infarct is evident by CT on this study. Vascular: No hyperdense vessel. There is no appreciable vascular calcification. Skull: The bony calvarium appears intact. Sinuses/Orbits: Visualized paranasal sinuses are clear. Visualized orbits appear symmetric bilaterally. Other: Mastoid air cells are clear. IMPRESSION: Decreased attenuation in the lower right pons at the site of prior infarct. Elsewhere gray-white compartments appear normal. No acute infarct evident. No mass or hemorrhage. Study otherwise unremarkable. Electronically Signed   By: Bretta Bang III M.D.   On: 06/17/2018 10:27   Mr Brain Wo Contrast  Result Date: 06/17/2018 CLINICAL DATA:  Abnormal speech.  History of stroke EXAM: MRI HEAD WITHOUT CONTRAST TECHNIQUE: Multiplanar, multiecho pulse sequences of the brain and surrounding structures were obtained without intravenous contrast. COMPARISON:  CT head 06/17/2018.  MRI 07/17/2015 FINDINGS: Brain: Acute infarct in the left pons. 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 Acute infarct in the posterior external capsule on the right. Scattered small white matter hyperintensities bilaterally consistent with chronic ischemia, mild. Ventricle size normal. Negative for hemorrhage or mass. No midline shift. Vascular: Normal arterial flow void Skull and upper cervical spine: Negative Sinuses/Orbits: Negative Other: None IMPRESSION: Acute infarct in the left pons. Acute infarct in the right posterior external capsule Relatively large chronic infarct in the right pons Electronically Signed   By: Marlan Palau M.D.   On: 06/17/2018 13:01    Patient seen and examined.  Clinical course and management discussed.  Necessary edits performed.  I agree with the above.  Assessment and plan of care developed and discussed below.     Assessment: 27 y.o. male  presenting with acute infarct in the left pons and right posterior external capsule causing right side weakness, difficulty swallowing, word finding difficulty, and loss of coordination. MRI brain personally reviewed as above. He has completed CTA head and neck which was negative with no abnormality noted in the posterior or anterior circulation. Echocardiogram did not show any significant abnormality, PFO could not be excluded. HgbA1c- 5.5,  LDL - 122. He was not on any antihypertensives, antiplatelet regimen or statin prior to admission.  Although patient with multiple vascular risk factors due to age further work up recommended to rule out an embolic etiology.  Concern remains for etiology of hypertension as well.    Stroke Risk Factors - family history, hyperlipidemia, hypertension and smoking  Plan: 1. ASA 325mg  daily for stroke prophylaxis 2. Statin for lipid management with target LDL<70. 3. Agree with work up for other possible causes of hypertension.  BP management with target SBP<140.   4. Smoking cessation counseling 5. Hypercoaguable work up pending 6. PT consult, OT consult, Speech consult 7. NPO until RN stroke swallow screen 8 Telemetry monitoring 9. Frequent neuro checks 10.  TEE 11. Baclofen 10mg  TID for hiccups.  May adjust dose as necessary.    This patient was staffed with Dr. Verlon Au, Thad Ranger who personally evaluated patient, reviewed documentation and agreed with assessment and plan of care as above.  Webb Silversmith, DNP, FNP-BC Board certified Nurse Practitioner Neurology Department  06/18/2018, 8:28 AM   Thana Farr, MD Neurology [phone]  06/18/2018  2:44 PM

## 2018-06-18 NOTE — Evaluation (Signed)
Speech Language Pathology Evaluation Patient Details Name: Tony Taylor MRN: [id number] DOB: 06-09-1991 Today's Date: 06/18/2018 Time: 4098-1191 SLP Time Calculation (min) (ACUTE ONLY): 60 min  Problem List:  Patient Active Problem List   Diagnosis Date Noted  . Acute CVA (cerebrovascular accident) (HCC) 06/17/2018  . 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 Left hemiparesis (HCC) 07/16/2015  . Accelerated hypertension 07/16/2015   Past Medical History:  Past Medical History:  Diagnosis Date  . CVA (cerebral infarction)   . Hypertension    Past Surgical History:  Past Surgical History:  Procedure Laterality Date  . NO PAST SURGERIES     HPI:  Pt is a 27 year old male with history of CVA, hypertension and tobacco dependence who is not taking any medications who presented to the emergency room initially due to hiccups and right-sided weakness for 3 days, MRI revealed new L side CVA (h/o R pons infarct 3 years ago) he left AMA and his brother encouraged him to come back as symptoms persisted. Pt is c/o the constant hiccups causing him to have phlegm and "throw up" so he is unable to eat/drink well. Pt is verbally conversive w/ 100% intelligibility but notices inconsistent, slight dysarthria intermittently where he has to "sat it carefully" but then is "goes away".     Assessment / Plan / Recommendation Clinical Impression  Pt appears to present w/ adequate Cognitive-linguistic communication skills and no receptive or expressive language deficits noted during bedside screening today. Pt was administered the [facility] Cognitive Assessment(MOCA) scoring a 29/30 - missing only 1 word during the delayed recall task (he did recall the word w/ a verbal cue after). Pt answered all questions/tasks quickly w/out hesitation, especially the Math task during Serial 7s. No deficits were noted in any of the tasks. Attention and vusiospatical/executive tasks were [facility]. No receptive or expressive language  deficits noted during the [facility] or during conversation w/ pt. Pt is verbally conversive w/ staff and SLP w/ 100% intelligibility, but pt notices inconsistent, slight dysarthria intermittently where he has to "say it carefully" but then this "goes away". Pt stated he had been using slower speech and pronouncing his words more carefully when he noticed this difference in his speech ~3 days ago. He felt it had improved "some" and stated "It doesn't always happen". Recomment pt f/u w/ Outpatient ST services for f/u w/ strategies and a more formal assessment if pt desires. Discussed and educated on general strategies to address his c/o the dysarthria which include slowing rate of speech; increasing volume slightly; over-exagerating or over-articulation of speech to enhance the motor planning during speech/conversation. Pt was able to demonstrate these strategies appropriately. Contact information given for discharge; pt may have possible f/u w/ a HH agency per CM. NSG updated and agreed.     SLP Assessment  SLP Recommendation/Assessment: Patient does not need any further Speech Lanaguage Pathology Services(he may seek f/u w/ Outpatient services as needed) SLP Visit Diagnosis: Dysarthria and anarthria (R47.1)(slight)    Follow Up Recommendations  None    Frequency and Duration (n/a)  (n/a)      SLP Evaluation Cognition  Overall Cognitive Status: Within Functional Limits for tasks assessed Arousal/Alertness: Awake/alert Orientation Level: Oriented X4 Attention: Focused;Sustained Focused Attention: Appears intact Sustained Attention: Appears intact Memory: Appears intact Awareness: Appears intact Problem Solving: Appears intact Executive Function: Reasoning;Sequencing;Organizing;Decision Making;Initiating Reasoning: Appears intact Sequencing: Appears intact Organizing: Appears intact Decision Making: Appears intact Initiating: Appears intact Behaviors: Restless;Poor frustration  tolerance Safety/Judgment: Appears intact Comments: however,  pt is not fully compliant w/ his medications per report       Comprehension  Auditory Comprehension Overall Auditory Comprehension: Appears within functional limits for tasks assessed Yes/No Questions: Within Functional Limits Commands: Within Functional Limits Conversation: Simple Interfering Components: (restless, hiccups) Visual Recognition/Discrimination Discrimination: Not tested Reading Comprehension Reading Status: Within funtional limits    Expression Expression Primary Mode of Expression: Verbal Verbal Expression Overall Verbal Expression: Appears within functional limits for tasks assessed Initiation: No impairment Automatic Speech: Name;Social Response;Counting Level of Generative/Spontaneous Verbalization: Conversation Repetition: No impairment Naming: No impairment Pragmatics: No impairment(no gross deficits) Interfering Components: (hiccups) Effective Techniques: (n/a) Non-Verbal Means of Communication: Not applicable Written Expression Dominant Hand: Right Written Expression: Not tested   Oral / Motor  Oral Motor/Sensory Function Overall Oral Motor/Sensory Function: Within functional limits Motor Speech Overall Motor Speech: Appears within functional limits for tasks assessed Respiration: Within functional limits Phonation: Normal Resonance: Within functional limits Articulation: Within functional limitis(slight, inconsistent dysarthria per his report) Intelligibility: Intelligible(100%) Motor Planning: Witnin functional limits Interfering Components: (n/a) Effective Techniques: Slow rate;Over-articulate   GO                      Jerilynn Som, MS, CCC-SLP Watson,Katherine 06/18/2018, 3:24 PM

## 2018-06-18 NOTE — Consult Note (Signed)
Date: 06/18/2018                  Patient Name:  Adrien Shankar  MRN: [id number]  DOB: Jul 24, 1991  Age / Sex: 27 y.o., male         PCP: Patient, No Pcp Per                 Service Requesting Consult: IM/ Adrian Saran, MD                 Reason for Consult: HTN            History of Present Illness: Patient is a 27 y.o. male with medical problems of HTN, Stroke, who was admitted to [facility] on 06/17/2018 for evaluation of stroke.  Nephrology consult has been requested for evaluation of malignant hypertension Yesterday, patient's blood pressure was 236/149 He denies any vision changes, headaches, chest pain, lower extremity edema Serum creatinine 1.31, GFR greater than 30 Urinalysis not available Patient underwent CT angiography of head and neck.  75 cc of IV contrast was used MRI showed acute lacunar infarcts in the pons and posterior right external capsule Large chronic infarct in the right pons   Medications: Outpatient medications: No medications prior to admission.    Current medications: Current Facility-Administered Medications  Medication Dose Route Frequency Provider Last Rate Last Dose  . acetaminophen (TYLENOL) tablet 650 mg  650 mg Oral Q4H PRN Arnaldo Natal, MD       Or  . acetaminophen (TYLENOL) solution 650 mg  650 mg Per Tube Q4H PRN Arnaldo Natal, MD       Or  . acetaminophen (TYLENOL) suppository 650 mg  650 mg Rectal Q4H PRN Arnaldo Natal, MD      . alum & mag hydroxide-simeth (MAALOX/MYLANTA) 200-200-20 MG/5ML suspension 30 mL  30 mL Oral Q4H PRN Arnaldo Natal, MD      . amLODipine (NORVASC) tablet 10 mg  10 mg Oral Daily Arnaldo Natal, MD   10 mg at 06/18/18 0959  . aspirin tablet 325 mg  325 mg Oral Daily Adrian Saran, MD   325 mg at 06/18/18 0959  . chlorproMAZINE (THORAZINE) 12.5 mg in sodium chloride 0.9 % 25 mL IVPB  12.5 mg Intravenous Q8H PRN Arnaldo Natal, MD   Stopped at 06/18/18 0131  . enoxaparin (LOVENOX) injection 40  mg  40 mg Subcutaneous Q24H Arnaldo Natal, MD      . hydrALAZINE (APRESOLINE) 50 MG tablet           . hydrALAZINE (APRESOLINE) injection 10 mg  10 mg Intravenous Q4H PRN Oralia Manis, MD   10 mg at 06/18/18 0347  . hydrALAZINE (APRESOLINE) tablet 50 mg  50 mg Oral Q8H Mody, Sital, MD      . hydrochlorothiazide (HYDRODIURIL) tablet 25 mg  25 mg Oral Daily Adrian Saran, MD   25 mg at 06/18/18 0959  . labetalol (NORMODYNE,TRANDATE) injection 10 mg  10 mg Intravenous Q2H PRN Arnaldo Natal, MD   10 mg at 06/18/18 0757  . nicotine (NICODERM CQ - dosed in mg/24 hours) patch 21 mg  21 mg Transdermal Daily Adrian Saran, MD   21 mg at 06/18/18 0959  . rosuvastatin (CRESTOR) tablet 20 mg  20 mg Oral Daily Adrian Saran, MD   20 mg at 06/18/18 0959  . senna-docusate (Senokot-S) tablet 1 tablet  1 tablet Oral QHS PRN Arnaldo Natal, MD  Allergies: No Known Allergies    Past Medical History: Past Medical History:  Diagnosis Date  . CVA (cerebral infarction)   . Hypertension      Past Surgical History: Past Surgical History:  Procedure Laterality Date  . NO PAST SURGERIES       Family History: Family History  Problem Relation Age of Onset  . Stroke Mother   . Hypertension Father      Social History: Social History   Socioeconomic History  . Marital status: Single    Spouse name: Not on file  . Number of children: Not on file  . Years of education: Not on file  . Highest education level: Not on file  Occupational History  . Not on file  Social Needs  . Financial resource strain: Not on file  . Food insecurity:    Worry: Not on file    Inability: Not on file  . Transportation needs:    Medical: Not on file    Non-medical: Not on file  Tobacco Use  . Smoking status: Current Every Day Smoker    Types: Cigarettes  . Smokeless tobacco: Never Used  Substance and Sexual Activity  . Alcohol use: No  . Drug use: No  . Sexual activity: Yes  Lifestyle  .  Physical activity:    Days per week: Not on file    Minutes per session: Not on file  . Stress: Not on file  Relationships  . Social connections:    Talks on phone: Not on file    Gets together: Not on file    Attends religious service: Not on file    Active member of club or organization: Not on file    Attends meetings of clubs or organizations: Not on file    Relationship status: Not on file  . Intimate partner violence:    Fear of current or ex partner: Not on file    Emotionally abused: Not on file    Physically abused: Not on file    Forced sexual activity: Not on file  Other Topics Concern  . Not on file  Social History Narrative  . Not on file     Review of Systems: Gen: Denies any fevers or chills no HEENT: Vision or hearing problems.  Some difficulty with speech CV: Chest pain due to hiccups Resp: No complaints, no cough or sputum production GI: Hiccups for the past 3 days GU : No blood in the urine, no problems with voiding MS: No joint swellings or effusions Derm:  No complaints of any rashes or itching Psych: No complaints Heme: No complaints Neuro: Stroke Endocrine.  No complaints  Vital Signs: Blood pressure (!) 181/113, pulse 67, temperature 98.6 F (37 C), temperature source Oral, resp. rate 20, height 6\' 5"  (1.956 m), weight 124.7 kg, SpO2 100 %.   Intake/Output Summary (Last 24 hours) at 06/18/2018 1135 Last data filed at 06/18/2018 0408 Gross per 24 hour  Intake 674.39 ml  Output -  Net 674.39 ml    Weight trends: [employer]   06/17/18 2204  Weight: 124.7 kg    Physical Exam: General:  Well-appearing gentleman, sitting in the chair  HEENT  anicteric, moist oral mucous membranes  Neck:  Supple, no JVD  Lungs:  Normal breathing effort, clear to auscultation  Heart::  Regular rhythm, no rub   Abdomen:  Soft, nontender  Extremities:  No peripheral edema  Neurologic:  Alert, oriented, some hesitancy in speech, ambulatory in room  Skin:   No acute rashes             Lab results: Basic Metabolic Panel: Recent Labs  Lab 06/17/18 1019  NA 138  K 3.9  CL 106  CO2 26  GLUCOSE 97  BUN 10  CREATININE 1.31*  CALCIUM 9.4    Liver Function Tests: Recent Labs  Lab 06/17/18 1019  AST 20  ALT 27  ALKPHOS 83  BILITOT 0.9  PROT 7.3  ALBUMIN 4.4   No results for input(s): LIPASE, AMYLASE in the last 168 hours. No results for input(s): AMMONIA in the last 168 hours.  CBC: Recent Labs  Lab 06/17/18 1019  WBC 6.1  NEUTROABS 4.1  HGB 14.9  HCT 43.8  MCV 91.2  PLT 196    Cardiac Enzymes: Recent Labs  Lab 06/17/18 1019  TROPONINI <0.03    BNP: Invalid input(s): POCBNP  CBG: Recent Labs  Lab 06/17/18 1019 06/17/18 1545  GLUCAP 110* 84    Microbiology: Recent Results (from the past 720 hour(s))  MRSA PCR Screening     Status: None   Collection Time: 06/17/18  5:32 PM  Result Value Ref Range Status   MRSA by PCR NEGATIVE NEGATIVE Final    Comment:        The GeneXpert MRSA Assay (FDA approved for NASAL specimens only), is one component of a comprehensive MRSA colonization surveillance program. It is not intended to diagnose MRSA infection nor to guide or monitor treatment for MRSA infections. Performed at [facility], [address] [place], [place] [address]      Coagulation Studies: No results for input(s): LABPROT, INR in the last 72 hours.  Urinalysis: No results for input(s): COLORURINE, LABSPEC, PHURINE, GLUCOSEU, HGBUR, BILIRUBINUR, KETONESUR, PROTEINUR, UROBILINOGEN, NITRITE, LEUKOCYTESUR in the last 72 hours.  Invalid input(s): APPERANCEUR      Imaging: Ct Angio Head W Or Wo Contrast  Result Date: 06/18/2018 CLINICAL DATA:  27 year old male with acute lacunar infarcts in the brainstem and posterior right external capsule on brain MRI yesterday. Previous right pontine infarct. EXAM: CT ANGIOGRAPHY HEAD AND NECK TECHNIQUE: Multidetector CT imaging of the head  and neck was performed using the standard protocol during bolus administration of intravenous contrast. Multiplanar CT image reconstructions and MIPs were obtained to evaluate the vascular anatomy. Carotid stenosis measurements (when applicable) are obtained utilizing NASCET criteria, using the distal internal carotid diameter as the denominator. CONTRAST:  75mL OMNIPAQUE IOHEXOL 350 MG/ML SOLN COMPARISON:  Brain MRI 06/17/2018. Head CT 06/17/2018. Brain MRI, MRA head and neck 07/17/2015. FINDINGS: CTA NECK Skeleton: Negative. Upper chest: Negative. Other neck: Negative; symmetric appearing adenoid hypertrophy is probably physiologic. Aortic arch: 4 vessel arch configuration with aberrancy origin of the right subclavian artery (normal variant). No arch atherosclerosis or great vessel origin stenosis. Right carotid system: Negative. Partially retropharyngeal course of the proximal right ICA (normal variant). Left carotid system: Negative. Vertebral arteries: After an origin of the right subclavian artery with no plaque or stenosis. The right vertebral artery is diminutive but patent from its origin to the skull base. Normal proximal left subclavian artery and left vertebral artery origin. The left vertebral artery appears dominant and is patent to the skull base without stenosis. CTA HEAD Posterior circulation: Stable and patent dominant distal left and diminutive distal right vertebral arteries to the vertebrobasilar junction. Both PICA origins are patent. Patent basilar artery without stenosis. Patent SCA and PCA origins. Posterior communicating arteries are diminutive or absent. Bilateral PCA branches are  within normal limits. Anterior circulation: Patent ICA siphons with no plaque or stenosis. Normal ophthalmic artery origins. Patent carotid termini. Normal MCA and ACA origins. Dominant left ACA A1 segment re-demonstrated. Anterior communicating artery and bilateral ACA branches are within normal limits. Left MCA  M1 segment, bifurcation, and left MCA branches are within normal limits. Right MCA M1 segment, trifurcation, and right MCA branches are within normal limits. Venous sinuses: Patent. Anatomic variants: Aberrancy origin of the right subclavian artery. Dominant left and diminutive right vertebral arteries. Dominant left ACA A1 segment. Delayed phase: No abnormal enhancement identified. No intracranial hemorrhage or mass effect. Stable gray-white matter differentiation throughout the brain. Review of the MIP images confirms the above findings IMPRESSION: 1. Negative CTA. No arterial abnormality in the head or neck. Normal arterial anatomic variants. 2.  Stable CT appearance of the brain since 06/17/2018. Electronically Signed   By: Odessa Fleming M.D.   On: 06/18/2018 09:39   Ct Head Wo Contrast  Result Date: 06/17/2018 CLINICAL DATA:  Right-sided weakness and dysphagia. History of right-sided pontine infarct. EXAM: CT HEAD WITHOUT CONTRAST TECHNIQUE: Contiguous axial images were obtained from the base of the skull through the vertex without intravenous contrast. COMPARISON:  Head CT July 16, 2015; brain MRI July 17, 2015 FINDINGS: Brain: The ventricles are normal in size and configuration. There is no evident intracranial mass, hemorrhage, extra-axial fluid collection, or midline shift. Decreased attenuation in the right lower pons is noted, consistent with prior documented infarct in this area. No new gray-white compartment lesions are evident. No acute infarct is evident by CT on this study. Vascular: No hyperdense vessel. There is no appreciable vascular calcification. Skull: The bony calvarium appears intact. Sinuses/Orbits: Visualized paranasal sinuses are clear. Visualized orbits appear symmetric bilaterally. Other: Mastoid air cells are clear. IMPRESSION: Decreased attenuation in the lower right pons at the site of prior infarct. Elsewhere gray-white compartments appear normal. No acute infarct evident. No mass  or hemorrhage. Study otherwise unremarkable. Electronically Signed   By: Bretta Bang III M.D.   On: 06/17/2018 10:27   Ct Angio Neck W Or Wo Contrast  Result Date: 06/18/2018 CLINICAL DATA:  27 year old male with acute lacunar infarcts in the brainstem and posterior right external capsule on brain MRI yesterday. Previous right pontine infarct. EXAM: CT ANGIOGRAPHY HEAD AND NECK TECHNIQUE: Multidetector CT imaging of the head and neck was performed using the standard protocol during bolus administration of intravenous contrast. Multiplanar CT image reconstructions and MIPs were obtained to evaluate the vascular anatomy. Carotid stenosis measurements (when applicable) are obtained utilizing NASCET criteria, using the distal internal carotid diameter as the denominator. CONTRAST:  32mL OMNIPAQUE IOHEXOL 350 MG/ML SOLN COMPARISON:  Brain MRI 06/17/2018. Head CT 06/17/2018. Brain MRI, MRA head and neck 07/17/2015. FINDINGS: CTA NECK Skeleton: Negative. Upper chest: Negative. Other neck: Negative; symmetric appearing adenoid hypertrophy is probably physiologic. Aortic arch: 4 vessel arch configuration with aberrancy origin of the right subclavian artery (normal variant). No arch atherosclerosis or great vessel origin stenosis. Right carotid system: Negative. Partially retropharyngeal course of the proximal right ICA (normal variant). Left carotid system: Negative. Vertebral arteries: After an origin of the right subclavian artery with no plaque or stenosis. The right vertebral artery is diminutive but patent from its origin to the skull base. Normal proximal left subclavian artery and left vertebral artery origin. The left vertebral artery appears dominant and is patent to the skull base without stenosis. CTA HEAD Posterior circulation: Stable and patent dominant distal left and  diminutive distal right vertebral arteries to the vertebrobasilar junction. Both PICA origins are patent. Patent basilar artery without  stenosis. Patent SCA and PCA origins. Posterior communicating arteries are diminutive or absent. Bilateral PCA branches are within normal limits. Anterior circulation: Patent ICA siphons with no plaque or stenosis. Normal ophthalmic artery origins. Patent carotid termini. Normal MCA and ACA origins. Dominant left ACA A1 segment re-demonstrated. Anterior communicating artery and bilateral ACA branches are within normal limits. Left MCA M1 segment, bifurcation, and left MCA branches are within normal limits. Right MCA M1 segment, trifurcation, and right MCA branches are within normal limits. Venous sinuses: Patent. Anatomic variants: Aberrancy origin of the right subclavian artery. Dominant left and diminutive right vertebral arteries. Dominant left ACA A1 segment. Delayed phase: No abnormal enhancement identified. No intracranial hemorrhage or mass effect. Stable gray-white matter differentiation throughout the brain. Review of the MIP images confirms the above findings IMPRESSION: 1. Negative CTA. No arterial abnormality in the head or neck. Normal arterial anatomic variants. 2.  Stable CT appearance of the brain since 06/17/2018. Electronically Signed   By: Odessa Fleming M.D.   On: 06/18/2018 09:39   Mr Brain Wo Contrast  Result Date: 06/17/2018 CLINICAL DATA:  Abnormal speech.  History of stroke EXAM: MRI HEAD WITHOUT CONTRAST TECHNIQUE: Multiplanar, multiecho pulse sequences of the brain and surrounding structures were obtained without intravenous contrast. COMPARISON:  CT head 06/17/2018.  MRI 07/17/2015 FINDINGS: Brain: Acute infarct in the left pons. 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 Acute infarct in the posterior external capsule on the right. Scattered small white matter hyperintensities bilaterally consistent with chronic ischemia, mild. Ventricle size normal. Negative for hemorrhage or mass. No midline shift. Vascular: Normal arterial flow void Skull and upper cervical spine: Negative  Sinuses/Orbits: Negative Other: None IMPRESSION: Acute infarct in the left pons. Acute infarct in the right posterior external capsule Relatively large chronic infarct in the right pons Electronically Signed   By: Marlan Palau M.D.   On: 06/17/2018 13:01      Assessment & Plan: Pt is a 27 y.o. African-American  male with history of stroke, severe hypertension, severe family history of kidney disease with father being on dialysis, was admitted on 06/17/2018 with malignant hypertension.   1.  Malignant hypertension Current blood pressure regimen includes amlodipine 10 mg daily, hydralazine 50 mg every 8 hours, hydrochlorothiazide 25 mg daily No ACE inhibitor or ARB at this time Previously patient did not tolerate metoprolol  Urinalysis not available Other risk factors include current smoking half pack per day.  Patient denies any kind of drug use or alcohol use  Patient was not taking any blood pressure medications until recently.  Would like to evaluate the effect of this regimen on his blood pressure Addition of low-dose ACE inhibitor can be considered We will order urinalysis for evaluation of urine protein  We will follow    LOS: 0 Elleni Mozingo Thedore Mins 9/6/201911:35 AM  [facility] [place], [place] [phone]  Note: This note was prepared with Dragon dictation. Any transcription errors are unintentional

## 2018-06-18 NOTE — Care Management (Signed)
Mr. Tony Taylor will have TEE Monday. So, will be at this facility over the weekend [name] RN MSN CCM Care Management [phone]

## 2018-06-19 LAB — TSH: TSH: 0.958 u[IU]/mL (ref 0.350–4.500)

## 2018-06-19 LAB — URINALYSIS, COMPLETE (UACMP) WITH MICROSCOPIC
BACTERIA UA: NONE SEEN
BILIRUBIN URINE: NEGATIVE
GLUCOSE, UA: NEGATIVE mg/dL
HGB URINE DIPSTICK: NEGATIVE
KETONES UR: 20 mg/dL — AB
LEUKOCYTES UA: NEGATIVE
NITRITE: NEGATIVE
PROTEIN: 30 mg/dL — AB
Specific Gravity, Urine: 1.028 (ref 1.005–1.030)
pH: 6 (ref 5.0–8.0)

## 2018-06-19 LAB — URINE DRUG SCREEN, QUALITATIVE (ARMC ONLY)
Amphetamines, Ur Screen: NOT DETECTED
BARBITURATES, UR SCREEN: NOT DETECTED
Benzodiazepine, Ur Scrn: POSITIVE — AB
CANNABINOID 50 NG, UR ~~LOC~~: POSITIVE — AB
Cocaine Metabolite,Ur ~~LOC~~: NOT DETECTED
MDMA (Ecstasy)Ur Screen: NOT DETECTED
Methadone Scn, Ur: NOT DETECTED
Opiate, Ur Screen: NOT DETECTED
PHENCYCLIDINE (PCP) UR S: NOT DETECTED
Tricyclic, Ur Screen: NOT DETECTED

## 2018-06-19 LAB — BASIC METABOLIC PANEL
Anion gap: 12 (ref 5–15)
BUN: 19 mg/dL (ref 6–20)
CHLORIDE: 103 mmol/L (ref 98–111)
CO2: 25 mmol/L (ref 22–32)
Calcium: 9.8 mg/dL (ref 8.9–10.3)
Creatinine, Ser: 1.44 mg/dL — ABNORMAL HIGH (ref 0.61–1.24)
GFR calc non Af Amer: >60 mL/min (ref >60)
Glucose, Bld: 108 mg/dL — ABNORMAL HIGH (ref 70–99)
Potassium: 3.7 mmol/L (ref 3.5–5.1)
SODIUM: 140 mmol/L (ref 135–145)

## 2018-06-19 LAB — PROTEIN / CREATININE RATIO, URINE
Creatinine, Urine: 382 mg/dL
Protein Creatinine Ratio: 0.06 mg/mg{Cre} (ref 0.00–0.15)
Total Protein, Urine: 22 mg/dL

## 2018-06-19 MED ORDER — HYDRALAZINE HCL 20 MG/ML IJ SOLN
10.0000 mg | INTRAMUSCULAR | Status: DC | PRN
  Administered 2018-06-20: 15:00:00 10 mg via INTRAVENOUS
  Filled 2018-06-19: qty 1

## 2018-06-19 MED ORDER — CALCIUM CARBONATE ANTACID 500 MG PO CHEW
1.0000 | CHEWABLE_TABLET | ORAL | Status: DC | PRN
  Administered 2018-06-19 – 2018-06-20 (×4): 200 mg via ORAL
  Filled 2018-06-19 (×4): qty 1

## 2018-06-19 MED ORDER — CLONIDINE HCL 0.3 MG/24HR TD PTWK
0.3000 mg | MEDICATED_PATCH | TRANSDERMAL | Status: DC
  Administered 2018-06-19: 0.3 mg via TRANSDERMAL
  Filled 2018-06-19: qty 1

## 2018-06-19 MED ORDER — POLYETHYLENE GLYCOL 3350 17 G PO PACK: 17.0000 g | PACK | Freq: Every day | ORAL | 0 refills | Status: AC

## 2018-06-19 MED ORDER — CHLORPROMAZINE HCL 25 MG PO TABS: 25.0000 mg | ORAL_TABLET | Freq: Three times a day (TID) | ORAL | Status: AC | PRN

## 2018-06-19 MED ORDER — LORAZEPAM 2 MG/ML IJ SOLN
2.0000 mg | Freq: Four times a day (QID) | INTRAMUSCULAR | Status: DC | PRN
  Administered 2018-06-19 – 2018-06-21 (×2): 2 mg via INTRAVENOUS
  Filled 2018-06-19 (×2): qty 1

## 2018-06-19 MED ORDER — ASPIRIN 325 MG PO TABS: 325.0000 mg | ORAL_TABLET | Freq: Every day | ORAL | 2 refills | Status: AC

## 2018-06-19 MED ORDER — NICOTINE 21 MG/24HR TD PT24: 21.0000 mg | MEDICATED_PATCH | Freq: Every day | TRANSDERMAL | 0 refills | Status: AC

## 2018-06-19 MED ORDER — POLYETHYLENE GLYCOL 3350 17 G PO PACK
17.0000 g | PACK | Freq: Every day | ORAL | Status: DC
  Administered 2018-06-19: 14:00:00 17 g via ORAL
  Filled 2018-06-19 (×2): qty 1

## 2018-06-19 MED ORDER — CLONIDINE 0.2 MG/24HR TD PTWK: 0.2000 mg | MEDICATED_PATCH | TRANSDERMAL | 12 refills | Status: AC

## 2018-06-19 MED ORDER — SODIUM CHLORIDE 0.9 % IV SOLN
25.0000 mg | INTRAVENOUS | Status: AC
  Administered 2018-06-19: 11:00:00 25 mg via INTRAVENOUS
  Filled 2018-06-19: qty 1

## 2018-06-19 MED ORDER — LORAZEPAM 2 MG/ML IJ SOLN
2.0000 mg | Freq: Once | INTRAMUSCULAR | Status: AC
  Administered 2018-06-19: 05:00:00 2 mg via INTRAVENOUS
  Filled 2018-06-19: qty 1

## 2018-06-19 MED ORDER — LABETALOL HCL 5 MG/ML IV SOLN
10.0000 mg | INTRAVENOUS | Status: DC | PRN
  Administered 2018-06-20: 10 mg via INTRAVENOUS
  Filled 2018-06-19: qty 4

## 2018-06-19 MED ORDER — ROSUVASTATIN CALCIUM 20 MG PO TABS: 20.0000 mg | ORAL_TABLET | Freq: Every day | ORAL | 2 refills | Status: AC

## 2018-06-19 MED ORDER — ALUM & MAG HYDROXIDE-SIMETH 200-200-20 MG/5ML PO SUSP: 30.0000 mL | ORAL | 0 refills | Status: AC | PRN

## 2018-06-19 MED ORDER — LORAZEPAM 2 MG/ML IJ SOLN: 2.0000 mg | Freq: Four times a day (QID) | INTRAMUSCULAR | 0 refills | Status: AC | PRN

## 2018-06-19 MED ORDER — DIPHENHYDRAMINE HCL 50 MG/ML IJ SOLN
25.0000 mg | Freq: Once | INTRAMUSCULAR | Status: DC
  Filled 2018-06-19: qty 0.5

## 2018-06-19 MED ORDER — CLONIDINE HCL 0.2 MG/24HR TD PTWK
0.2000 mg | MEDICATED_PATCH | TRANSDERMAL | Status: DC
  Administered 2018-06-19: 13:00:00 0.2 mg via TRANSDERMAL
  Filled 2018-06-19: qty 1

## 2018-06-19 NOTE — Progress Notes (Signed)
PT Cancellation Note  Patient Details Name: Tony Taylor MRN: [id number] DOB: 08-16-1991   Cancelled Treatment:    Reason Eval/Treat Not Completed: Other (comment). Transferring to [facility] so will hold therapy as pt has continued to have medical issues.  Will see if pt is still here in the AM.   Ivar Drape 06/19/2018, 12:59 PM   Samul Dada, PT MS Acute Rehab Dept. Number: [facility] [id number] and [facility] [phone]

## 2018-06-19 NOTE — Progress Notes (Signed)
BP remains elevated. Hydralazine IV given as ordered. Maalox given for heartburn with pt currently having hiccups. Up to BR with 1+ with right sided weakness. Urine samples sent to lab. Brother at bedside.  Will continue to monitor BP and hiccups.

## 2018-06-19 NOTE — Progress Notes (Addendum)
At 0914 pt awakened for 4 hour vs and neuro check. BP elevated with pt unable to cooperate with neuro check or NIH with pt having onset strong hiccups with nausea and dry heaving with movement. BP monitored and assessed. Dr. Nemiah Commander who was on the floor in to see pt. Pt given labetolol and zofran for nausea. Pt was warm and diaphoretic.  Brother in room and making numerous requests.  Med given as ordered with BP trending down and now normalizing. Pt reports severe heartburn with maalox and pepcid given with relief. Pt was agreeable to take other po meds.  Pt and family request transfer of care to [facility] with [facility] accepting [facility] with [facility] beds available. Pt currently sleeping quietly except awakened with BP check and requested to be turned. Has not voided up to this time.

## 2018-06-19 NOTE — Progress Notes (Signed)
PT Cancellation Note  Patient Details Name: Tony Taylor MRN: [id number] DOB: [birth year]   Cancelled Treatment:    Reason Eval/Treat Not Completed: Other (comment)   Attempted several times this am.  Sleeping initially, BP elevated and outside PT guidelines for mobility later in AM, then sleeping soundly and unable to awaken on 3rd attempt.  Will try to reschedule for this PM.    Danielle Dess 06/19/2018, 11:22 AM

## 2018-06-19 NOTE — Progress Notes (Addendum)
[facility] [place] at [facility]   PATIENT NAME: Tony Taylor    MR#:  [id number]  DATE OF BIRTH:  03-Jul-1991  SUBJECTIVE:  CHIEF COMPLAINT:   Chief Complaint  Patient presents with  . Hiccups   - BP still very elevated - right sided weakness - continuous hiccups  REVIEW OF SYSTEMS:  Review of Systems  Constitutional: Negative for chills and fever.  HENT: Negative for congestion, ear discharge, hearing loss and nosebleeds.        Hiccups  Eyes: Negative for blurred vision and double vision.  Respiratory: Negative for cough, shortness of breath and wheezing.   Cardiovascular: Negative for chest pain and palpitations.  Gastrointestinal: Negative for abdominal pain, constipation, diarrhea, nausea and vomiting.  Genitourinary: Negative for dysuria.  Musculoskeletal: Negative for myalgias.  Skin: Negative for rash.  Neurological: Positive for focal weakness. Negative for dizziness, sensory change, speech change, seizures and headaches.  Psychiatric/Behavioral: Negative for depression.    DRUG ALLERGIES:  No Known Allergies  VITALS:  Blood pressure 134/76, pulse (!) 106, temperature 99.6 F (37.6 C), temperature source Oral, resp. rate 20, height 6\' 5"  (1.956 m), weight 124.7 kg, SpO2 100 %.  PHYSICAL EXAMINATION:  Physical Exam  GENERAL:  27 y.o.-year-old patient lying in the bed with no acute distress.  EYES: Pupils equal, round, reactive to light and accommodation. No scleral icterus. Extraocular muscles intact.  HEENT: Head atraumatic, normocephalic. Oropharynx and nasopharynx clear. Continuous hiccups NECK:  Supple, no jugular venous distention. No thyroid enlargement, no tenderness.  LUNGS: Normal breath sounds bilaterally, no wheezing, rales,rhonchi or crepitation. No use of accessory muscles of respiration.  CARDIOVASCULAR: S1, S2 normal. No murmurs, rubs, or gallops.  ABDOMEN: Soft, nontender, nondistended. Bowel sounds present. No  organomegaly or mass.  EXTREMITIES: No pedal edema, cyanosis, or clubbing.  NEUROLOGIC: Cranial nerves II through XII are intact. Muscle strength 5/5 in both LUE and LLE, 4/5 RUE and RLE . Sensation intact. Gait not checked.  PSYCHIATRIC: The patient is alert and oriented x 3.  SKIN: No obvious rash, lesion, or ulcer.    LABORATORY PANEL:   CBC Recent Labs  Lab 06/17/18 1019  WBC 6.1  HGB 14.9  HCT 43.8  PLT 196   ------------------------------------------------------------------------------------------------------------------  Chemistries  Recent Labs  Lab 06/17/18 1019 06/19/18 0444  NA 138 140  K 3.9 3.7  CL 106 103  CO2 26 25  GLUCOSE 97 108*  BUN 10 19  CREATININE 1.31* 1.44*  CALCIUM 9.4 9.8  AST 20  --   ALT 27  --   ALKPHOS 83  --   BILITOT 0.9  --    ------------------------------------------------------------------------------------------------------------------  Cardiac Enzymes Recent Labs  Lab 06/17/18 1019  TROPONINI <0.03   ------------------------------------------------------------------------------------------------------------------  RADIOLOGY:  Ct Angio Head W Or Wo Contrast  Result Date: 06/18/2018 CLINICAL DATA:  27 year old male with acute lacunar infarcts in the brainstem and posterior right external capsule on brain MRI yesterday. Previous right pontine infarct. EXAM: CT ANGIOGRAPHY HEAD AND NECK TECHNIQUE: Multidetector CT imaging of the head and neck was performed using the standard protocol during bolus administration of intravenous contrast. Multiplanar CT image reconstructions and MIPs were obtained to evaluate the vascular anatomy. Carotid stenosis measurements (when applicable) are obtained utilizing NASCET criteria, using the distal internal carotid diameter as the denominator. CONTRAST:  7mL OMNIPAQUE IOHEXOL 350 MG/ML SOLN COMPARISON:  Brain MRI 06/17/2018. Head CT 06/17/2018. Brain MRI, MRA head and neck 07/17/2015. FINDINGS: CTA  NECK Skeleton:  Negative. Upper chest: Negative. Other neck: Negative; symmetric appearing adenoid hypertrophy is probably physiologic. Aortic arch: 4 vessel arch configuration with aberrancy origin of the right subclavian artery (normal variant). No arch atherosclerosis or great vessel origin stenosis. Right carotid system: Negative. Partially retropharyngeal course of the proximal right ICA (normal variant). Left carotid system: Negative. Vertebral arteries: After an origin of the right subclavian artery with no plaque or stenosis. The right vertebral artery is diminutive but patent from its origin to the skull base. Normal proximal left subclavian artery and left vertebral artery origin. The left vertebral artery appears dominant and is patent to the skull base without stenosis. CTA HEAD Posterior circulation: Stable and patent dominant distal left and diminutive distal right vertebral arteries to the vertebrobasilar junction. Both PICA origins are patent. Patent basilar artery without stenosis. Patent SCA and PCA origins. Posterior communicating arteries are diminutive or absent. Bilateral PCA branches are within normal limits. Anterior circulation: Patent ICA siphons with no plaque or stenosis. Normal ophthalmic artery origins. Patent carotid termini. Normal MCA and ACA origins. Dominant left ACA A1 segment re-demonstrated. Anterior communicating artery and bilateral ACA branches are within normal limits. Left MCA M1 segment, bifurcation, and left MCA branches are within normal limits. Right MCA M1 segment, trifurcation, and right MCA branches are within normal limits. Venous sinuses: Patent. Anatomic variants: Aberrancy origin of the right subclavian artery. Dominant left and diminutive right vertebral arteries. Dominant left ACA A1 segment. Delayed phase: No abnormal enhancement identified. No intracranial hemorrhage or mass effect. Stable gray-white matter differentiation throughout the brain. Review of the  MIP images confirms the above findings IMPRESSION: 1. Negative CTA. No arterial abnormality in the head or neck. Normal arterial anatomic variants. 2.  Stable CT appearance of the brain since 06/17/2018. Electronically Signed   By: Odessa Fleming M.D.   On: 06/18/2018 09:39   Ct Angio Neck W Or Wo Contrast  Result Date: 06/18/2018 CLINICAL DATA:  27 year old male with acute lacunar infarcts in the brainstem and posterior right external capsule on brain MRI yesterday. Previous right pontine infarct. EXAM: CT ANGIOGRAPHY HEAD AND NECK TECHNIQUE: Multidetector CT imaging of the head and neck was performed using the standard protocol during bolus administration of intravenous contrast. Multiplanar CT image reconstructions and MIPs were obtained to evaluate the vascular anatomy. Carotid stenosis measurements (when applicable) are obtained utilizing NASCET criteria, using the distal internal carotid diameter as the denominator. CONTRAST:  75mL OMNIPAQUE IOHEXOL 350 MG/ML SOLN COMPARISON:  Brain MRI 06/17/2018. Head CT 06/17/2018. Brain MRI, MRA head and neck 07/17/2015. FINDINGS: CTA NECK Skeleton: Negative. Upper chest: Negative. Other neck: Negative; symmetric appearing adenoid hypertrophy is probably physiologic. Aortic arch: 4 vessel arch configuration with aberrancy origin of the right subclavian artery (normal variant). No arch atherosclerosis or great vessel origin stenosis. Right carotid system: Negative. Partially retropharyngeal course of the proximal right ICA (normal variant). Left carotid system: Negative. Vertebral arteries: After an origin of the right subclavian artery with no plaque or stenosis. The right vertebral artery is diminutive but patent from its origin to the skull base. Normal proximal left subclavian artery and left vertebral artery origin. The left vertebral artery appears dominant and is patent to the skull base without stenosis. CTA HEAD Posterior circulation: Stable and patent dominant distal  left and diminutive distal right vertebral arteries to the vertebrobasilar junction. Both PICA origins are patent. Patent basilar artery without stenosis. Patent SCA and PCA origins. Posterior communicating arteries are diminutive or absent. Bilateral PCA branches are  within normal limits. Anterior circulation: Patent ICA siphons with no plaque or stenosis. Normal ophthalmic artery origins. Patent carotid termini. Normal MCA and ACA origins. Dominant left ACA A1 segment re-demonstrated. Anterior communicating artery and bilateral ACA branches are within normal limits. Left MCA M1 segment, bifurcation, and left MCA branches are within normal limits. Right MCA M1 segment, trifurcation, and right MCA branches are within normal limits. Venous sinuses: Patent. Anatomic variants: Aberrancy origin of the right subclavian artery. Dominant left and diminutive right vertebral arteries. Dominant left ACA A1 segment. Delayed phase: No abnormal enhancement identified. No intracranial hemorrhage or mass effect. Stable gray-white matter differentiation throughout the brain. Review of the MIP images confirms the above findings IMPRESSION: 1. Negative CTA. No arterial abnormality in the head or neck. Normal arterial anatomic variants. 2.  Stable CT appearance of the brain since 06/17/2018. Electronically Signed   By: Odessa Fleming M.D.   On: 06/18/2018 09:39   Mr Brain Wo Contrast  Result Date: 06/17/2018 CLINICAL DATA:  Abnormal speech.  History of stroke EXAM: MRI HEAD WITHOUT CONTRAST TECHNIQUE: Multiplanar, multiecho pulse sequences of the brain and surrounding structures were obtained without intravenous contrast. COMPARISON:  CT head 06/17/2018.  MRI 07/17/2015 FINDINGS: Brain: Acute infarct in the left pons. 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 Acute infarct in the posterior external capsule on the right. Scattered small white matter hyperintensities bilaterally consistent with chronic ischemia, mild. Ventricle size  normal. Negative for hemorrhage or mass. No midline shift. Vascular: Normal arterial flow void Skull and upper cervical spine: Negative Sinuses/Orbits: Negative Other: None IMPRESSION: Acute infarct in the left pons. Acute infarct in the right posterior external capsule Relatively large chronic infarct in the right pons Electronically Signed   By: Marlan Palau M.D.   On: 06/17/2018 13:01    EKG:   Orders placed or performed during the hospital encounter of 06/17/18  . ED EKG  . ED EKG  . EKG    ASSESSMENT AND PLAN:   27 year old African-American male with past medical history significant for uncontrolled hypertension, history of stroke with no residual deficits presents to hospital secondary to right-sided weakness.  1.  Acute stroke-MRI showing acute infarct in left pons and also in right posterior external capsule.  There is an old chronic infarct in the right pons. -CT angiogram of the head and neck are otherwise unremarkable. -Echocardiogram showing normal EF, no wall motion abnormalities.  Unfortunately bubble study was not ordered. -Currently on aspirin and statin.  Appreciate neurology input.  They have recommended hypercoagulable work-up to be sent out for which has been sent. -LDL of 122, A1c is 5.5.  TSH is pending -Plan for TEE on Monday if patient still is here. -PT and OT consults requested.  2.  Continuous hiccups-normal LFTs.  Not sure if phrenic nerve involvement is present. -Improving with distraction.  Continue baclofen if tolerated.  Ativan and Thorazine as needed  3.  Uncontrolled hypertension-permissive elevated blood pressure allowed due to stroke.  However symptoms greater than 5 days now. -Since unable to take oral medicines due to hiccups and nausea, started on clonidine patch today. -Continue IV hydralazine and labetalol as needed. -Appreciate nephrology consult  4.  Tobacco use disorder-nicotine patch ordered  5.  DVT prophylaxis-on Lovenox  Patient and  family requested transfer to [facility].  Discussed with neurologist Dr. Molinda Bailiff at [facility], accepted patient.  Awaiting bed prior to transfer.   All the records are reviewed and case discussed with Care Management/Social Workerr.  Management plans discussed with the patient, family and they are in agreement.  CODE STATUS: Full Code  TOTAL TIME TAKING CARE OF THIS PATIENT: 38 minutes.   POSSIBLE D/C IN 2 DAYS, DEPENDING ON CLINICAL CONDITION.   Enid Baas M.D on 06/19/2018 at 12:20 PM  Between 7am to 6pm - Pager - [phone]  After 6pm go to [email or web address] - password [email or web address]  [facility] [place] [facility]  Office  [phone]  CC: Primary care physician; Patient, No Pcp Per

## 2018-06-19 NOTE — Progress Notes (Signed)
Continues to yell out complaining of hiccoughs and burning in chest. Unable to calm and cautioned against forcefully throwing up.  Dr Sheryle Hail made aware.  Pt request to be transferred "[facility]".

## 2018-06-19 NOTE — Progress Notes (Signed)
OT Cancellation Note  Patient Details Name: Cashden Hornbeak MRN: [id number] DOB: 1991-08-06   Cancelled Treatment:    Reason Eval/Treat Not Completed: Patient not medically ready(BP 195/129 this a.m. when attempted initial OT visit. Will continue to monitor and eval  when medically appropriate.)  Olegario Messier, MS, OTR/L 06/19/2018, 12:19 PM

## 2018-06-19 NOTE — Progress Notes (Signed)
Staffed via TC with Dr. Dalene Carrow with update given on pt hiccups, elevated BP and meds given. BP med parameters adjusted per MD order. Pt currently sleeping with eyes closed.

## 2018-06-19 NOTE — Progress Notes (Signed)
Sleeping but arousable after IV Ativan.  Pt reports during injection "I can tell that's the medicine.  My hiccoughs have stopped already".

## 2018-06-19 NOTE — Plan of Care (Signed)
B/P remains elevated but trending down with prn medications.

## 2018-06-19 NOTE — Discharge Summary (Signed)
[facility] - [place] at [facility]   PATIENT NAME: Tony Taylor    MR#:  [id number]  DATE OF BIRTH:  [birth year]  DATE OF ADMISSION:  06/17/2018   ADMITTING PHYSICIAN: Arnaldo Natal, MD  DATE OF DISCHARGE:  06/19/18  PRIMARY CARE PHYSICIAN: Patient, No Pcp Per   ADMISSION DIAGNOSIS:   Cerebrovascular accident (CVA), unspecified mechanism (HCC) [I63.9]  DISCHARGE DIAGNOSIS:   Active Problems:   Stroke (cerebrum) (HCC)   SECONDARY DIAGNOSIS:   Past Medical History:  Diagnosis Date  . CVA (cerebral infarction)   . Hypertension     HOSPITAL COURSE:   27 year old African-American male with past medical history significant for uncontrolled hypertension, history of stroke with no residual deficits presents to hospital secondary to right-sided weakness.  1.  Acute stroke-MRI showing acute infarct in left pons and also in right posterior external capsule.  There is an old chronic infarct in the right pons. -CT angiogram of the head and neck are otherwise unremarkable. -Echocardiogram showing normal EF, no wall motion abnormalities.  Unfortunately bubble study was not ordered. -Currently on aspirin and statin.  Appreciate neurology input.  They have recommended hypercoagulable work-up to be sent out for which has been sent. -LDL of 122, A1c is 5.5.  TSH is pending -Plan for TEE on Monday if patient still is here. -PT and OT consults requested.  2.  Continuous hiccups-normal LFTs.  Not sure if phrenic nerve involvement is present. -Improving with distraction.  Continue baclofen if tolerated.  Ativan and Thorazine as needed  3.  Uncontrolled hypertension-permissive elevated blood pressure allowed due to stroke.  However symptoms greater than 5 days now. -Since unable to take oral medicines due to hiccups and nausea, started on clonidine patch today. -Continue IV hydralazine and labetalol as needed. -Appreciate nephrology consult  4.  Tobacco use  disorder-nicotine patch ordered  5.  DVT prophylaxis-on Lovenox  Patient and family requested transfer to [facility].  Discussed with neurologist Dr. Molinda Bailiff at [facility], accepted patient.  Awaiting bed prior to transfer.  DISCHARGE CONDITIONS:   Guarded  CONSULTS OBTAINED:   Treatment Team:  Thana Farr, MD Mosetta Pigeon, MD  DRUG ALLERGIES:   No Known Allergies DISCHARGE MEDICATIONS:   Allergies as of 06/19/2018   No Known Allergies     Medication List    TAKE these medications   alum & mag hydroxide-simeth 200-200-20 MG/5ML suspension Commonly known as:  MAALOX/MYLANTA Take 30 mLs by mouth every 4 (four) hours as needed for indigestion or heartburn.   aspirin 325 MG tablet Take 1 tablet (325 mg total) by mouth daily. Start taking on:  06/20/2018   chlorproMAZINE 25 MG tablet Commonly known as:  THORAZINE Take 1 tablet (25 mg total) by mouth 3 (three) times daily as needed (hiccups).   cloNIDine 0.2 mg/24hr patch Commonly known as:  CATAPRES - Dosed in mg/24 hr Place 1 patch (0.2 mg total) onto the skin once a week.   LORazepam 2 MG/ML injection Commonly known as:  ATIVAN Inject 1 mL (2 mg total) into the vein every 6 (six) hours as needed (hiccups).   nicotine 21 mg/24hr patch Commonly known as:  NICODERM CQ - dosed in mg/24 hours Place 1 patch (21 mg total) onto the skin daily. Start taking on:  06/20/2018   polyethylene glycol packet Commonly known as:  MIRALAX / GLYCOLAX Take 17 g by mouth daily.   rosuvastatin 20 MG tablet Commonly known as:  CRESTOR Take 1 tablet (  20 mg total) by mouth daily. Start taking on:  06/20/2018        DISCHARGE INSTRUCTIONS:   1. Patient will be transferred to [facility] Neurology service when bed available  DIET:   Cardiac diet  ACTIVITY:   Activity as tolerated  OXYGEN:   Home Oxygen: No.  Oxygen Delivery: room air  DISCHARGE LOCATION:   [facility]  If you experience worsening of your admission  symptoms, develop shortness of breath, life threatening emergency, suicidal or homicidal thoughts you must seek medical attention immediately by calling 911 or calling your MD immediately  if symptoms less severe.  You Must read complete instructions/literature along with all the possible adverse reactions/side effects for all the Medicines you take and that have been prescribed to you. Take any new Medicines after you have completely understood and accpet all the possible adverse reactions/side effects.   Please note  You were cared for by a hospitalist during your hospital stay. If you have any questions about your discharge medications or the care you received while you were in the hospital after you are discharged, you can call the unit and asked to speak with the hospitalist on call if the hospitalist that took care of you is not available. Once you are discharged, your primary care physician will handle any further medical issues. Please note that NO REFILLS for any discharge medications will be authorized once you are discharged, as it is imperative that you return to your primary care physician (or establish a relationship with a primary care physician if you do not have one) for your aftercare needs so that they can reassess your need for medications and monitor your lab values.    On the day of Discharge:  VITAL SIGNS:   Blood pressure 134/76, pulse (!) 106, temperature 99.6 F (37.6 C), temperature source Oral, resp. rate 20, height 6\' 5"  (1.956 m), weight 124.7 kg, SpO2 100 %.  PHYSICAL EXAMINATION:   GENERAL:  27 y.o.-year-old patient lying in the bed with no acute distress.  EYES: Pupils equal, round, reactive to light and accommodation. No scleral icterus. Extraocular muscles intact.  HEENT: Head atraumatic, normocephalic. Oropharynx and nasopharynx clear. Continuous hiccups NECK:  Supple, no jugular venous distention. No thyroid enlargement, no tenderness.  LUNGS: Normal breath  sounds bilaterally, no wheezing, rales,rhonchi or crepitation. No use of accessory muscles of respiration.  CARDIOVASCULAR: S1, S2 normal. No murmurs, rubs, or gallops.  ABDOMEN: Soft, nontender, nondistended. Bowel sounds present. No organomegaly or mass.  EXTREMITIES: No pedal edema, cyanosis, or clubbing.  NEUROLOGIC: Cranial nerves II through XII are intact. Muscle strength 5/5 in both LUE and LLE, 4/5 RUE and RLE . Sensation intact. Gait not checked.  PSYCHIATRIC: The patient is alert and oriented x 3.  SKIN: No obvious rash, lesion, or ulcer.   DATA REVIEW:   CBC Recent Labs  Lab 06/17/18 1019  WBC 6.1  HGB 14.9  HCT 43.8  PLT 196    Chemistries  Recent Labs  Lab 06/17/18 1019 06/19/18 0444  NA 138 140  K 3.9 3.7  CL 106 103  CO2 26 25  GLUCOSE 97 108*  BUN 10 19  CREATININE 1.31* 1.44*  CALCIUM 9.4 9.8  AST 20  --   ALT 27  --   ALKPHOS 83  --   BILITOT 0.9  --      Microbiology Results  Results for orders placed or performed during the hospital encounter of 06/17/18  MRSA PCR Screening  Status: None   Collection Time: 06/17/18  5:32 PM  Result Value Ref Range Status   MRSA by PCR NEGATIVE NEGATIVE Final    Comment:        The GeneXpert MRSA Assay (FDA approved for NASAL specimens only), is one component of a comprehensive MRSA colonization surveillance program. It is not intended to diagnose MRSA infection nor to guide or monitor treatment for MRSA infections. Performed at [facility], [address] [place], [place] [address]     RADIOLOGY:  No results found.   Management plans discussed with the patient, family and they are in agreement.  CODE STATUS:     Code Status Orders  (From admission, onward)         Start     Ordered   06/18/18 0025  Full code  Continuous     06/18/18 0024        Code Status History    Date Active Date Inactive Code Status Order ID Comments User Context   06/17/2018 1431 06/17/2018 2154  Full Code [id number]  Enedina Finner, MD ED   07/16/2015 2345 07/17/2015 1800 Full Code [id number]  Oralia Manis, MD Inpatient      TOTAL TIME TAKING CARE OF THIS PATIENT: 37 minutes.    Enid Baas M.D on 06/19/2018 at 12:27 PM  Between 7am to 6pm - Pager - [phone]  After 6pm go to [email or web address] - Social research officer, government  [facility] [place] [facility]  Office  [phone]  CC: Primary care physician; Patient, No Pcp Per   Note: This dictation was prepared with Dragon dictation along with smaller phrase technology. Any transcriptional errors that result from this process are unintentional.

## 2018-06-19 NOTE — Progress Notes (Addendum)
Rang out to report I'm throwing up.  States "I'm not nauseated, I just focus on throwing up to relieve the burning in my chest so that I can lay down to sleep.  Zofran and Mylanta offered but declined. Ice chips given.  Cautioned against forcefully regurgitating which could lead to low potassium level. Large amount brown liquid emesis noted in trash can.

## 2018-06-19 NOTE — Progress Notes (Signed)
Pt having hiccups again- thorazine po given. BP elevated with labetolol given. Family at bedside; encouraged quiet environment.

## 2018-06-19 NOTE — Progress Notes (Signed)
Pt currently sleeping quietly.

## 2018-06-19 NOTE — Progress Notes (Signed)
Pt with nausea and vomiting of coffee ground emesis.  Reports persistent hiccoughs since in hospital.  MD made aware.  Zofran and Thorazine given as ordered.

## 2018-06-19 NOTE — Progress Notes (Signed)
[facility], [place] 06/19/18  Subjective:   Patient was agitated last night This morning, sleeping soundly.  Blood pressure controlled at 134/76   Objective:  Vital signs in last 24 hours:  Temp:  [98 F (36.7 C)-99.6 F (37.6 C)] 99.6 F (37.6 C) (09/07 0908) Pulse Rate:  [71-157] 106 (09/07 1142) Resp:  [16-22] 20 (09/07 1142) BP: (134-225)/(76-174) 134/76 (09/07 1142) SpO2:  [95 %-100 %] 100 % (09/07 1142)  Weight change:  Filed Weights   06/17/18 2204  Weight: 124.7 kg    Intake/Output:    Intake/Output Summary (Last 24 hours) at 06/19/2018 1144 Last data filed at 06/19/2018 1100 Gross per 24 hour  Intake 25 ml  Output -  Net 25 ml     Physical Exam: General:  No acute distress, laying in the bed  HEENT  eyes closed,  Pulm/lungs  normal breathing effort, room air, clear to auscultation  CVS/Heart  regular rhythm  Abdomen:   Soft  Extremities:  No peripheral edema  Neurologic:  Resting quietly  Skin:  No acute rashes          Basic Metabolic Panel:  Recent Labs  Lab 06/17/18 1019 06/19/18 0444  NA 138 140  K 3.9 3.7  CL 106 103  CO2 26 25  GLUCOSE 97 108*  BUN 10 19  CREATININE 1.31* 1.44*  CALCIUM 9.4 9.8     CBC: Recent Labs  Lab 06/17/18 1019  WBC 6.1  NEUTROABS 4.1  HGB 14.9  HCT 43.8  MCV 91.2  PLT 196     No results found for: HEPBSAG, HEPBSAB, HEPBIGM    Microbiology:  Recent Results (from the past 240 hour(s))  MRSA PCR Screening     Status: None   Collection Time: 06/17/18  5:32 PM  Result Value Ref Range Status   MRSA by PCR NEGATIVE NEGATIVE Final    Comment:        The GeneXpert MRSA Assay (FDA approved for NASAL specimens only), is one component of a comprehensive MRSA colonization surveillance program. It is not intended to diagnose MRSA infection nor to guide or monitor treatment for MRSA infections. Performed at [facility], [address] [place], [place]  [address]     Coagulation Studies: No results for input(s): LABPROT, INR in the last 72 hours.  Urinalysis: No results for input(s): COLORURINE, LABSPEC, PHURINE, GLUCOSEU, HGBUR, BILIRUBINUR, KETONESUR, PROTEINUR, UROBILINOGEN, NITRITE, LEUKOCYTESUR in the last 72 hours.  Invalid input(s): APPERANCEUR    Imaging: Ct Angio Head W Or Wo Contrast  Result Date: 06/18/2018 CLINICAL DATA:  27 year old male with acute lacunar infarcts in the brainstem and posterior right external capsule on brain MRI yesterday. Previous right pontine infarct. EXAM: CT ANGIOGRAPHY HEAD AND NECK TECHNIQUE: Multidetector CT imaging of the head and neck was performed using the standard protocol during bolus administration of intravenous contrast. Multiplanar CT image reconstructions and MIPs were obtained to evaluate the vascular anatomy. Carotid stenosis measurements (when applicable) are obtained utilizing NASCET criteria, using the distal internal carotid diameter as the denominator. CONTRAST:  75mL OMNIPAQUE IOHEXOL 350 MG/ML SOLN COMPARISON:  Brain MRI 06/17/2018. Head CT 06/17/2018. Brain MRI, MRA head and neck 07/17/2015. FINDINGS: CTA NECK Skeleton: Negative. Upper chest: Negative. Other neck: Negative; symmetric appearing adenoid hypertrophy is probably physiologic. Aortic arch: 4 vessel arch configuration with aberrancy origin of the right subclavian artery (normal variant). No arch atherosclerosis or great vessel origin stenosis. Right carotid system: Negative. Partially retropharyngeal course of  the proximal right ICA (normal variant). Left carotid system: Negative. Vertebral arteries: After an origin of the right subclavian artery with no plaque or stenosis. The right vertebral artery is diminutive but patent from its origin to the skull base. Normal proximal left subclavian artery and left vertebral artery origin. The left vertebral artery appears dominant and is patent to the skull base without stenosis. CTA HEAD  Posterior circulation: Stable and patent dominant distal left and diminutive distal right vertebral arteries to the vertebrobasilar junction. Both PICA origins are patent. Patent basilar artery without stenosis. Patent SCA and PCA origins. Posterior communicating arteries are diminutive or absent. Bilateral PCA branches are within normal limits. Anterior circulation: Patent ICA siphons with no plaque or stenosis. Normal ophthalmic artery origins. Patent carotid termini. Normal MCA and ACA origins. Dominant left ACA A1 segment re-demonstrated. Anterior communicating artery and bilateral ACA branches are within normal limits. Left MCA M1 segment, bifurcation, and left MCA branches are within normal limits. Right MCA M1 segment, trifurcation, and right MCA branches are within normal limits. Venous sinuses: Patent. Anatomic variants: Aberrancy origin of the right subclavian artery. Dominant left and diminutive right vertebral arteries. Dominant left ACA A1 segment. Delayed phase: No abnormal enhancement identified. No intracranial hemorrhage or mass effect. Stable gray-white matter differentiation throughout the brain. Review of the MIP images confirms the above findings IMPRESSION: 1. Negative CTA. No arterial abnormality in the head or neck. Normal arterial anatomic variants. 2.  Stable CT appearance of the brain since 06/17/2018. Electronically Signed   By: Odessa Fleming M.D.   On: 06/18/2018 09:39   Ct Angio Neck W Or Wo Contrast  Result Date: 06/18/2018 CLINICAL DATA:  27 year old male with acute lacunar infarcts in the brainstem and posterior right external capsule on brain MRI yesterday. Previous right pontine infarct. EXAM: CT ANGIOGRAPHY HEAD AND NECK TECHNIQUE: Multidetector CT imaging of the head and neck was performed using the standard protocol during bolus administration of intravenous contrast. Multiplanar CT image reconstructions and MIPs were obtained to evaluate the vascular anatomy. Carotid stenosis  measurements (when applicable) are obtained utilizing NASCET criteria, using the distal internal carotid diameter as the denominator. CONTRAST:  10mL OMNIPAQUE IOHEXOL 350 MG/ML SOLN COMPARISON:  Brain MRI 06/17/2018. Head CT 06/17/2018. Brain MRI, MRA head and neck 07/17/2015. FINDINGS: CTA NECK Skeleton: Negative. Upper chest: Negative. Other neck: Negative; symmetric appearing adenoid hypertrophy is probably physiologic. Aortic arch: 4 vessel arch configuration with aberrancy origin of the right subclavian artery (normal variant). No arch atherosclerosis or great vessel origin stenosis. Right carotid system: Negative. Partially retropharyngeal course of the proximal right ICA (normal variant). Left carotid system: Negative. Vertebral arteries: After an origin of the right subclavian artery with no plaque or stenosis. The right vertebral artery is diminutive but patent from its origin to the skull base. Normal proximal left subclavian artery and left vertebral artery origin. The left vertebral artery appears dominant and is patent to the skull base without stenosis. CTA HEAD Posterior circulation: Stable and patent dominant distal left and diminutive distal right vertebral arteries to the vertebrobasilar junction. Both PICA origins are patent. Patent basilar artery without stenosis. Patent SCA and PCA origins. Posterior communicating arteries are diminutive or absent. Bilateral PCA branches are within normal limits. Anterior circulation: Patent ICA siphons with no plaque or stenosis. Normal ophthalmic artery origins. Patent carotid termini. Normal MCA and ACA origins. Dominant left ACA A1 segment re-demonstrated. Anterior communicating artery and bilateral ACA branches are within normal limits. Left MCA M1 segment,  bifurcation, and left MCA branches are within normal limits. Right MCA M1 segment, trifurcation, and right MCA branches are within normal limits. Venous sinuses: Patent. Anatomic variants: Aberrancy  origin of the right subclavian artery. Dominant left and diminutive right vertebral arteries. Dominant left ACA A1 segment. Delayed phase: No abnormal enhancement identified. No intracranial hemorrhage or mass effect. Stable gray-white matter differentiation throughout the brain. Review of the MIP images confirms the above findings IMPRESSION: 1. Negative CTA. No arterial abnormality in the head or neck. Normal arterial anatomic variants. 2.  Stable CT appearance of the brain since 06/17/2018. Electronically Signed   By: Odessa Fleming M.D.   On: 06/18/2018 09:39   Mr Brain Wo Contrast  Result Date: 06/17/2018 CLINICAL DATA:  Abnormal speech.  History of stroke EXAM: MRI HEAD WITHOUT CONTRAST TECHNIQUE: Multiplanar, multiecho pulse sequences of the brain and surrounding structures were obtained without intravenous contrast. COMPARISON:  CT head 06/17/2018.  MRI 07/17/2015 FINDINGS: Brain: Acute infarct in the left pons. 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 Acute infarct in the posterior external capsule on the right. Scattered small white matter hyperintensities bilaterally consistent with chronic ischemia, mild. Ventricle size normal. Negative for hemorrhage or mass. No midline shift. Vascular: Normal arterial flow void Skull and upper cervical spine: Negative Sinuses/Orbits: Negative Other: None IMPRESSION: Acute infarct in the left pons. Acute infarct in the right posterior external capsule Relatively large chronic infarct in the right pons Electronically Signed   By: Marlan Palau M.D.   On: 06/17/2018 13:01     Medications:    . aspirin  325 mg Oral Daily  . cloNIDine  0.3 mg Transdermal Weekly  . diphenhydrAMINE  25 mg Intravenous Once  . enoxaparin (LOVENOX) injection  40 mg Subcutaneous Q24H  . famotidine  20 mg Oral BID  . nicotine  21 mg Transdermal Daily  . polyethylene glycol  17 g Oral Daily  . rosuvastatin  20 mg Oral Daily  . sodium chloride flush  3 mL Intravenous Q12H    acetaminophen **OR** acetaminophen (TYLENOL) oral liquid 160 mg/5 mL **OR** acetaminophen, alum & mag hydroxide-simeth, alum & mag hydroxide-simeth, chlorproMAZINE, hydrALAZINE, labetalol, LORazepam, ondansetron (ZOFRAN) IV, senna-docusate, traZODone  Assessment/ Plan:  27 y.o. African-American male with history of stroke, severe hypertension,  family history of sevre kidney disease with father being on dialysis, was admitted on 06/17/2018 with malignant hypertension, stroke.   1.  Malignant hypertension Current regimen of clonidine patch, hydralazine 50 mg 3 times a day No ACE inhibitor or ARB at this time Previously patient did not tolerate metoprolol Blood pressure is well controlled at this time but clonidine patch is expensive.  Outpatient availability may be an issue. Urinalysis, urine protein to creatinine ratio, UDS are pending Serum creatinine slightly higher at 1.44 today.  TSH normal. Will follow   LOS: 1 Aryia Delira Thedore Mins 9/7/201911:44 AM  [facility] [place], [place] [phone]  Note: This note was prepared with Dragon dictation. Any transcription errors are unintentional

## 2018-06-20 LAB — CARDIOLIPIN ANTIBODIES, IGG, IGM, IGA: Anticardiolipin IgM: <9 MPL U/mL (ref 0–12)

## 2018-06-20 MED ORDER — DIAZEPAM 5 MG PO TABS
5.0000 mg | ORAL_TABLET | Freq: Two times a day (BID) | ORAL | Status: DC
  Administered 2018-06-20 (×2): 5 mg via ORAL
  Filled 2018-06-20 (×2): qty 1

## 2018-06-20 MED ORDER — AMLODIPINE BESYLATE 10 MG PO TABS
10.0000 mg | ORAL_TABLET | Freq: Every day | ORAL | Status: DC
  Administered 2018-06-20: 15:00:00 10 mg via ORAL
  Filled 2018-06-20: qty 1

## 2018-06-20 NOTE — Progress Notes (Signed)
[facility] at [facility]   PATIENT NAME: Tony Taylor    MR#:  [id number]  DATE OF BIRTH:  1991-05-16  SUBJECTIVE:  CHIEF COMPLAINT:   Chief Complaint  Patient presents with  . Hiccups   -Blood pressure is better controlled.  No hiccups noted while sleeping and eating.  Still complains of hiccups otherwise with dry heaves  REVIEW OF SYSTEMS:  Review of Systems  Constitutional: Negative for chills and fever.  HENT: Negative for congestion, ear discharge, hearing loss and nosebleeds.        Hiccups  Eyes: Negative for blurred vision and double vision.  Respiratory: Negative for cough, shortness of breath and wheezing.   Cardiovascular: Negative for chest pain and palpitations.  Gastrointestinal: Negative for abdominal pain, constipation, diarrhea, nausea and vomiting.  Genitourinary: Negative for dysuria.  Musculoskeletal: Negative for myalgias.  Skin: Negative for rash.  Neurological: Positive for focal weakness. Negative for dizziness, sensory change, speech change, seizures and headaches.  Psychiatric/Behavioral: Negative for depression.    DRUG ALLERGIES:  No Known Allergies  VITALS:  Blood pressure (!) 192/118, pulse 88, temperature 98.6 F (37 C), temperature source Oral, resp. rate 20, height 6\' 5"  (1.956 m), weight 124.7 kg, SpO2 98 %.  PHYSICAL EXAMINATION:  Physical Exam  GENERAL:  27 y.o.-year-old patient lying in the bed with no acute distress.  EYES: Pupils equal, round, reactive to light and accommodation. No scleral icterus. Extraocular muscles intact.  HEENT: Head atraumatic, normocephalic. Oropharynx and nasopharynx clear. Continuous hiccups NECK:  Supple, no jugular venous distention. No thyroid enlargement, no tenderness.  LUNGS: Normal breath sounds bilaterally, no wheezing, rales,rhonchi or crepitation. No use of accessory muscles of respiration.  CARDIOVASCULAR: S1, S2 normal. No murmurs, rubs, or gallops.  ABDOMEN:  Soft, nontender, nondistended. Bowel sounds present. No organomegaly or mass.  EXTREMITIES: No pedal edema, cyanosis, or clubbing.  NEUROLOGIC: Cranial nerves II through XII are intact. Muscle strength 5/5 in both LUE and LLE, 3/5 RUE and 3/5 RLE . Sensation intact. Gait not checked.  PSYCHIATRIC: The patient is alert and oriented x 3.  SKIN: No obvious rash, lesion, or ulcer.    LABORATORY PANEL:   CBC Recent Labs  Lab 06/17/18 1019  WBC 6.1  HGB 14.9  HCT 43.8  PLT 196   ------------------------------------------------------------------------------------------------------------------  Chemistries  Recent Labs  Lab 06/17/18 1019 06/19/18 0444  NA 138 140  K 3.9 3.7  CL 106 103  CO2 26 25  GLUCOSE 97 108*  BUN 10 19  CREATININE 1.31* 1.44*  CALCIUM 9.4 9.8  AST 20  --   ALT 27  --   ALKPHOS 83  --   BILITOT 0.9  --    ------------------------------------------------------------------------------------------------------------------  Cardiac Enzymes Recent Labs  Lab 06/17/18 1019  TROPONINI <0.03   ------------------------------------------------------------------------------------------------------------------  RADIOLOGY:  No results found.  EKG:   Orders placed or performed during the hospital encounter of 06/17/18  . ED EKG  . ED EKG  . EKG    ASSESSMENT AND PLAN:   27 year old African-American male with past medical history significant for uncontrolled hypertension, history of stroke with no residual deficits presents to hospital secondary to right-sided weakness.  1.  Acute stroke-MRI showing acute infarct in left pons and also in right posterior external capsule.  There is an old chronic infarct in the right pons. -CT angiogram of the head and neck are otherwise unremarkable. -Echocardiogram showing normal EF, no wall motion abnormalities.  Unfortunately bubble study  was not ordered. -Currently on aspirin and statin.  Appreciate neurology input.   They have recommended hypercoagulable work-up to be sent out for which has been sent. -LDL of 122, A1c is 5.5.  TSH is pending -Plan for TEE on Monday if patient still is here. Will need to notify cardiology -PT and OT consults requested.  2.  Continuous hiccups-normal LFTs.  Not sure if phrenic nerve involvement is present. -Improving with distraction.   Started valium as ativan is helping. - Ativan and Thorazine as needed  3.  Uncontrolled hypertension-permissive elevated blood pressure allowed due to stroke.  However symptoms greater than 5 days now. -Since unable to take oral medicines due to hiccups and nausea, - started on clonidine patch with improvement. -Continue IV hydralazine and labetalol as needed. -Appreciate nephrology consult  4.  Tobacco use disorder-nicotine patch  5.  DVT prophylaxis-on Lovenox   Patient and family requested transfer to [facility].  Discussed with neurologist Dr. Molinda Bailiff at [facility], accepted patient.  Awaiting bed prior to transfer.   All the records are reviewed and case discussed with Care Management/Social Workerr. Management plans discussed with the patient, family and they are in agreement.  CODE STATUS: Full Code  TOTAL TIME TAKING CARE OF THIS PATIENT: 39 minutes.   POSSIBLE D/C IN 2 DAYS, DEPENDING ON CLINICAL CONDITION.   Enid Baas M.D on 06/20/2018 at 12:38 PM  Between 7am to 6pm - Pager - [phone]  After 6pm go to [email or web address] - password [email or web address]  [facility]  Office  [phone]  CC: Primary care physician; Patient, No Pcp Per

## 2018-06-20 NOTE — Progress Notes (Signed)
Pt had several bouts of Hiccups throughout the night. Multiple medications given to help control hiccups. Pt vomiting at times r/t hiccups/coughing. Pt states it "feels like air/food is stuck" in the epigastric area. Breath sounds clear. Pt denies chest pain. VS stable/ within parameters. Anxious at times. Currently resting.

## 2018-06-20 NOTE — Plan of Care (Signed)
Limited education due to BP readings and pt condition; medicated level.

## 2018-06-20 NOTE — Progress Notes (Addendum)
Paged and spoke with Dr. Mack Hook regarding rising BP readings and actions with labetolol given and followed by hydralazine. Discussed current BP meds; norvasc 10 mg po given. Continues to have recurrent long episodes of hiccups with reflux/nausea/heartburn. Zofran, thorazine, TUMs and Maalox given as ordered with partial benefit. Up to BR with walker with pt unstable on right leg/knee with counter balance x 2. Up to chair with walker/1+. Recheck BP reading improved. Brother or family in room most of day. Comfort measures continued; tolerating po's and small amounts of foods at intervals. No communication from [facility] regarding transfer. Pt educated on NPO after 12 MN for possible TEE.

## 2018-06-20 NOTE — Progress Notes (Addendum)
Pt had brief run tachycardia of HR in 140's per telemetry tech. Pt now running NSR to Sinus tachy in 100's. Pt asymptomatic currently. Dr. Nemiah Commander notified and she advised she was with pt with pt having heaves.

## 2018-06-20 NOTE — Progress Notes (Addendum)
TC with Toniann Fail at [facility] bed control with updated condition and vitals signs/planned procedures given.  Will continue to wait for bed availablity.

## 2018-06-20 NOTE — Progress Notes (Addendum)
Physical Therapy Treatment Patient Details Name: Tony Taylor MRN: [id number] DOB: 1991/06/17 Today's Date: 06/20/2018    History of Present Illness 27 year old male with history of CVA, hypertension and tobacco dependence who is not on any medications who presented to the emergency room initially due to hiccups and right-sided weakness, MRI revealed new L side CVA (h/o R pons infarct 3 years ago) he left AMA and his brother encouraged him to come back as symptoms have persisted.    PT Comments    Chart reviewed, BP remains elevated; Dr. Nemiah Commander gives ok to work with pt while DBP remains <162mmHg. Pt in BR upon arrival, assisted by NA, agreeable to participate once finished with cleanup. AMB 300+ feet, first portion without assistive device, which appears quite unsafe, intermittent MinA required for safe recovery from multiple LOB. Pt uses a RW with improved independence, but fatigue onsets quickly (after 110ft) and quality of AMB motor control declines rapidly, concerning for increased falls risk. Pt is trialed on several  Exercises in attempts to find activities that he is able to perform with minimal assistance. Pt remains very weak in the RLE att he ankle, knee and hip level, seemingly moreso than when evaluated a couple days ago. Family educated on HEP assistance.    Follow Up Recommendations  Outpatient PT;Supervision for mobility/OOB     Equipment Recommendations  Rolling walker with 5" wheels    Recommendations for Other Services       Precautions / Restrictions Precautions Precautions: Fall    Mobility  Bed Mobility Overal bed mobility: Modified Independent             General bed mobility comments: additional effort adn time needed, BU Euse for management of RLE   Transfers Overall transfer level: Needs assistance Equipment used: Rolling walker (2 wheeled) Transfers: Sit to/from Stand Sit to Stand: Supervision         General transfer comment: performed  8x from EOB, elevated to ~27 inches; high effort required, and low quads strength noted on Right decreasing stability upon risinig.   Ambulation/Gait Ambulation/Gait assistance: Min guard Gait Distance (Feet): 330 Feet(363ft, then 193ft after rest break ) Assistive device: Rolling walker (2 wheeled)   Gait velocity: 0.23m/s   General Gait Details: Frequent high amplitude LOB; heavy ataxia, limiting hemiplegia in the right ankle, and right quads weakness resultant in bother recurvatum and buckling strategies for stability. Quality declines quickly after 139ft, would recommend frequenct rest breaks between bouts for safety adn to protect knee joint.    Stairs             Wheelchair Mobility    Modified Rankin (Stroke Patients Only)       Balance Overall balance assessment: Needs assistance         Standing balance support: During functional activity Standing balance-Leahy Scale: Fair Standing balance comment: in gait, supervision to minGuard with RW; minguard to minA without                            Cognition Arousal/Alertness: Awake/alert Behavior During Therapy: Anxious;WFL for tasks assessed/performed Overall Cognitive Status: Within Functional Limits for tasks assessed                                        Exercises Other Exercises Other Exercises: STS 2x5 from elevated surface; Seated bilat ankle  PF 1x10 (req self assist Taylor BUE); SEated bilat ankle DF 1x10 requires max-totalA on Rt from author or self with gait belt; Supine SAQ 1x10 (modA for placement of bolster only); Supine Heel hip abduction/add 1x10 (minA for transverse plane control);  Other Exercises: STanding Rt hip abduction (unable to safely perform at this time); Seated LAQ 1x10 AA/ROM (unable to perform at this time without max-total Assist); SLR supine (unable to perform at this time) ,        General Comments        Pertinent Vitals/Pain Pain Assessment: No/denies  pain    Home Living                      Prior Function            PT Goals (current goals can now be found in the care plan section) Acute Rehab PT Goals Patient Stated Goal: get back where he can sing, go back to work, return to his normal PT Goal Formulation: With patient Time For Goal Achievement: 07/02/18 Potential to Achieve Goals: Fair Progress towards PT goals: PT to reassess next treatment    Frequency    7X/week      PT Plan Current plan remains appropriate    Co-evaluation              AM-PAC PT "6 Clicks" Daily Activity  Outcome Measure  Difficulty turning over in bed (including adjusting bedclothes, sheets and blankets)?: A Lot Difficulty moving from lying on back to sitting on the side of the bed? : A Lot Difficulty sitting down on and standing up from a chair with arms (e.g., wheelchair, bedside commode, etc,.)?: Unable Help needed moving to and from a bed to chair (including a wheelchair)?: A Little Help needed walking in hospital room?: A Little Help needed climbing 3-5 steps with a railing? : A Lot 6 Click Score: 13    End of Session Equipment Utilized During Treatment: Gait belt Activity Tolerance: Patient tolerated treatment well;Patient limited by fatigue(pt winded, and diaphoretic at end of session) Patient left: with call bell/phone within reach;in bed;with family/visitor present Nurse Communication: Mobility status PT Visit Diagnosis: Ataxic gait (R26.0);Other symptoms and signs involving the nervous system (R29.898)     Time: 1610-9604 PT Time Calculation (min) (ACUTE ONLY): 50 min  Charges:  $Therapeutic Exercise: 23-37 mins $Therapeutic Activity: 8-22 mins                     2:54 PM, 06/20/18 Tony Taylor, PT, DPT Physical Therapist - [facility]  [phone] (ASCOM)      Tony Taylor 06/20/2018, 2:50 PM

## 2018-06-21 LAB — PROTEIN S, TOTAL: PROTEIN S AG TOTAL: 84 % (ref 60–150)

## 2018-06-21 LAB — LUPUS ANTICOAGULANT PANEL
DRVVT: 42.4 s (ref 0.0–47.0)
PTT Lupus Anticoagulant: 34.2 s (ref 0.0–51.9)

## 2018-06-21 LAB — PROTEIN C ACTIVITY: Protein C Activity: 159 % (ref 73–180)

## 2018-06-21 LAB — PROTEIN S ACTIVITY: PROTEIN S ACTIVITY: 126 % (ref 63–140)

## 2018-06-21 LAB — BETA-2-GLYCOPROTEIN I ABS, IGG/M/A: Beta-2-Glycoprotein I IgM: <9 GPI IgM units (ref 0–32)

## 2018-06-21 LAB — PROTEIN C, TOTAL: Protein C, Total: 146 % (ref 60–150)

## 2018-06-21 LAB — HOMOCYSTEINE: HOMOCYSTEINE-NORM: 21.6 umol/L — AB (ref 0.0–15.0)

## 2018-06-21 MED ORDER — AMLODIPINE BESYLATE 10 MG PO TABS: 10.0000 mg | ORAL_TABLET | Freq: Every day | ORAL | 3 refills | Status: AC

## 2018-06-21 MED ORDER — CALCIUM CARBONATE ANTACID 500 MG PO CHEW: 1.0000 | CHEWABLE_TABLET | ORAL | 3 refills | Status: AC | PRN

## 2018-06-21 NOTE — Progress Notes (Signed)
Pt a&ox4 with no confusion. VS stable. MD notified of transfer bed available. EMTALA completed and Medical necessity form completed and sent with EMS. Pt Discharge packet sent with EMS. Pt having bouts of hiccups, with vomiting at times. Ativan given at 0146 for hiccups and anxiety prior to transfer. Brother at bedside and following EMS to [facility]. IV in place, clean dry and intact. Report called to [facility] RN Raynelle Fanning. Pt NPO since midnight. Monitored until transferred via EMS.

## 2018-06-24 LAB — PROTHROMBIN GENE MUTATION

## 2018-06-24 LAB — FACTOR 5 LEIDEN

## 2018-07-12 NOTE — Discharge Summary (Signed)
Patient left AMA  No discharge diagnosis, medication list will be dictated since patient left AMA  Tony Taylor is a 27 year old gentleman with history of hypertension who has history of noncompliance along with polysubstance abuse comes to the emergency room with right-sided weakness. He was found to have acute CVA in the setting of hypertensive urgency. Patient was admitted in the ICU. Neurology consultation was placed. Patient left from the ICU AMA. ICU attending declined to do discharge summary and so I am doing a short version of the summary.

## 2019-05-23 ENCOUNTER — Emergency Department
Admission: EM | Admit: 2019-05-23 | Discharge: 2019-05-23 | Disposition: A | Discharge status: Other Acute Inpatient Hospital | Payer: Medicaid Other | Attending: Emergency Medicine | Admitting: Emergency Medicine

## 2019-05-23 ENCOUNTER — Emergency Department: Payer: Medicaid Other

## 2019-05-23 ENCOUNTER — Other Ambulatory Visit: Payer: Self-pay

## 2019-05-23 DIAGNOSIS — Z20828 Contact with and (suspected) exposure to other viral communicable diseases: Secondary | ICD-10-CM | POA: Insufficient documentation

## 2019-05-23 DIAGNOSIS — I629 Nontraumatic intracranial hemorrhage, unspecified: Secondary | ICD-10-CM | POA: Diagnosis not present

## 2019-05-23 DIAGNOSIS — Z79899 Other long term (current) drug therapy: Secondary | ICD-10-CM | POA: Insufficient documentation

## 2019-05-23 DIAGNOSIS — R404 Transient alteration of awareness: Secondary | ICD-10-CM | POA: Diagnosis present

## 2019-05-23 DIAGNOSIS — I10 Essential (primary) hypertension: Secondary | ICD-10-CM | POA: Diagnosis not present

## 2019-05-23 DIAGNOSIS — F17210 Nicotine dependence, cigarettes, uncomplicated: Secondary | ICD-10-CM | POA: Diagnosis not present

## 2019-05-23 LAB — CBC WITH DIFFERENTIAL/PLATELET
Abs Immature Granulocytes: 0.05 10*3/uL (ref 0.00–0.07)
Basophils Absolute: 0.2 10*3/uL — ABNORMAL HIGH (ref 0.0–0.1)
Basophils Relative: 2 %
Eosinophils Absolute: 0.2 10*3/uL (ref 0.0–0.5)
Eosinophils Relative: 2 %
HCT: 48.8 % (ref 39.0–52.0)
Hemoglobin: 16.2 g/dL (ref 13.0–17.0)
Immature Granulocytes: 0 %
Lymphocytes Relative: 40 %
Lymphs Abs: 5 10*3/uL — ABNORMAL HIGH (ref 0.7–4.0)
MCH: 30.7 pg (ref 26.0–34.0)
MCHC: 33.2 g/dL (ref 30.0–36.0)
MCV: 92.4 fL (ref 80.0–100.0)
Monocytes Absolute: 0.6 10*3/uL (ref 0.1–1.0)
Monocytes Relative: 5 %
Neutro Abs: 6.3 10*3/uL (ref 1.7–7.7)
Neutrophils Relative %: 51 %
Platelets: 245 10*3/uL (ref 150–400)
RBC: 5.28 MIL/uL (ref 4.22–5.81)
RDW: 12.5 % (ref 11.5–15.5)
WBC: 12.4 10*3/uL — ABNORMAL HIGH (ref 4.0–10.5)
nRBC: 0 % (ref 0.0–0.2)

## 2019-05-23 LAB — TYPE AND SCREEN
ABO/RH(D): O POS
Antibody Screen: NEGATIVE

## 2019-05-23 LAB — COMPREHENSIVE METABOLIC PANEL
ALT: 30 U/L (ref 0–44)
AST: 33 U/L (ref 15–41)
Albumin: 4.8 g/dL (ref 3.5–5.0)
Alkaline Phosphatase: 130 U/L — ABNORMAL HIGH (ref 38–126)
Anion gap: 17 — ABNORMAL HIGH (ref 5–15)
BUN: 16 mg/dL (ref 6–20)
CO2: 19 mmol/L — ABNORMAL LOW (ref 22–32)
Calcium: 8.8 mg/dL — ABNORMAL LOW (ref 8.9–10.3)
Chloride: 102 mmol/L (ref 98–111)
Creatinine, Ser: 1.82 mg/dL — ABNORMAL HIGH (ref 0.61–1.24)
GFR calc Af Amer: 58 mL/min — ABNORMAL LOW (ref >60)
GFR calc non Af Amer: 50 mL/min — ABNORMAL LOW (ref >60)
Glucose, Bld: 246 mg/dL — ABNORMAL HIGH (ref 70–99)
Potassium: 2.9 mmol/L — ABNORMAL LOW (ref 3.5–5.1)
Sodium: 138 mmol/L (ref 135–145)
Total Bilirubin: 0.7 mg/dL (ref 0.3–1.2)
Total Protein: 8.3 g/dL — ABNORMAL HIGH (ref 6.5–8.1)

## 2019-05-23 LAB — ETHANOL: Alcohol, Ethyl (B): <10 mg/dL (ref <10)

## 2019-05-23 LAB — URINE DRUG SCREEN, QUALITATIVE (ARMC ONLY)
Amphetamines, Ur Screen: NOT DETECTED
Barbiturates, Ur Screen: NOT DETECTED
Benzodiazepine, Ur Scrn: NOT DETECTED
Cannabinoid 50 Ng, Ur ~~LOC~~: NOT DETECTED
Cocaine Metabolite,Ur ~~LOC~~: NOT DETECTED
MDMA (Ecstasy)Ur Screen: NOT DETECTED
Methadone Scn, Ur: NOT DETECTED
Opiate, Ur Screen: NOT DETECTED
Phencyclidine (PCP) Ur S: NOT DETECTED
Tricyclic, Ur Screen: NOT DETECTED

## 2019-05-23 LAB — BLOOD GAS, ARTERIAL
Acid-base deficit: 5.4 mmol/L — ABNORMAL HIGH (ref 0.0–2.0)
Bicarbonate: 20 mmol/L (ref 20.0–28.0)
FIO2: 1
MECHVT: 500 mL
Mechanical Rate: 22
O2 Saturation: 99.3 %
PEEP: 5 cmH2O
Patient temperature: 37
pCO2 arterial: 38 mmHg (ref 32.0–48.0)
pH, Arterial: 7.33 — ABNORMAL LOW (ref 7.350–7.450)
pO2, Arterial: 155 mmHg — ABNORMAL HIGH (ref 83.0–108.0)

## 2019-05-23 LAB — URINALYSIS, COMPLETE (UACMP) WITH MICROSCOPIC
Bacteria, UA: NONE SEEN
Bilirubin Urine: NEGATIVE
Glucose, UA: 150 mg/dL — AB
Ketones, ur: 5 mg/dL — AB
Leukocytes,Ua: NEGATIVE
Nitrite: NEGATIVE
Protein, ur: >=300 mg/dL — AB
Specific Gravity, Urine: 1.012 (ref 1.005–1.030)
pH: 6 (ref 5.0–8.0)

## 2019-05-23 LAB — SARS CORONAVIRUS 2 BY RT PCR (HOSPITAL ORDER, PERFORMED IN ~~LOC~~ HOSPITAL LAB): SARS Coronavirus 2: NEGATIVE

## 2019-05-23 LAB — TROPONIN I (HIGH SENSITIVITY): Troponin I (High Sensitivity): 39 ng/L — ABNORMAL HIGH (ref <18)

## 2019-05-23 LAB — PROTIME-INR
INR: 1 (ref 0.8–1.2)
Prothrombin Time: 13 seconds (ref 11.4–15.2)

## 2019-05-23 LAB — LIPASE, BLOOD: Lipase: 36 U/L (ref 11–51)

## 2019-05-23 MED ORDER — PANTOPRAZOLE SODIUM 40 MG IV SOLR: 40.0000 mg | Freq: Every day | INTRAVENOUS | Status: DC

## 2019-05-23 MED ORDER — SODIUM CHLORIDE 0.9 % IV SOLN
1.0000 g | Freq: Once | INTRAVENOUS | Status: DC
  Filled 2019-05-23: qty 1

## 2019-05-23 MED ORDER — PROPOFOL 1000 MG/100ML IV EMUL: 5.0000 ug/kg/min | INTRAVENOUS | Status: DC

## 2019-05-23 MED ORDER — NICARDIPINE HCL IN NACL 20-0.86 MG/200ML-% IV SOLN
0.0000 mg/h | INTRAVENOUS | Status: DC
  Administered 2019-05-23: 2 mg/h via INTRAVENOUS
  Filled 2019-05-23: qty 200

## 2019-05-23 MED ORDER — LABETALOL HCL 5 MG/ML IV SOLN
20.0000 mg | Freq: Once | INTRAVENOUS | Status: AC
  Administered 2019-05-23: 10 mg via INTRAVENOUS

## 2019-05-23 MED ORDER — VANCOMYCIN HCL 10 G IV SOLR: 1.0000 g | Freq: Once | INTRAVENOUS | Status: DC

## 2019-05-23 MED ORDER — MANNITOL 25 % IV SOLN
25.0000 g | INTRAVENOUS | Status: AC
  Administered 2019-05-23: 02:00:00 25 g via INTRAVENOUS
  Filled 2019-05-23: qty 100

## 2019-05-23 MED ORDER — VANCOMYCIN HCL IN DEXTROSE 1-5 GM/200ML-% IV SOLN: 1000.0000 mg | Freq: Once | INTRAVENOUS | Status: DC

## 2019-05-23 NOTE — ED Notes (Signed)
Per shima, pharmacist give mannitol over 30 minutes with a filter.

## 2019-05-23 NOTE — Progress Notes (Addendum)
Ch was page regarding pt being admitted in ED. Pt has air/con pre and has not yet been screened for CV-19. Ch f/u with pt family to determine if they were en route. Pt father was on his way to [facility]. Ch confirmed that pt was at [facility] and left father a message providing the correct location.    28-May-2019 0100  Clinical Encounter Type  Visited With Family;Health care provider  Visit Type Critical Care;ED  Referral From Nurse  Consult/Referral To Chaplain  Recommendations F/U with family

## 2019-05-23 NOTE — ED Provider Notes (Signed)
[facility] Emergency Department Provider Note _______   First MD Initiated Contact with Patient 05/31/2019 0125     (approximate)  I have reviewed the triage vital signs and the nursing notes.  Level 5 caveat: History review of system limited secondary to patient being unresponsive HISTORY  Chief Complaint Unresponsive   HPI Tony Taylor is a 28 y.o. male with below list of previous medical conditions including CVA and hypertension presents to the emergency department via EMS unresponsive.  Per EMS family informed him that the patient was up playing video games when they heard a thud on arrival to the room they found the patient on the floor unresponsive.  On EMS arrival patient unresponsive to verbal/noxious stimuli.  EMS placed a [facility] airway in route.  Patient noted to be markedly hypertensive on their arrival with a systolic blood pressure of 161 diastolic of 096.  On arrival to the emergency department patient's blood pressure 192/129       Past Medical History:  Diagnosis Date   CVA (cerebral infarction)    Hypertension     Patient Active Problem List   Diagnosis Date Noted   Acute CVA (cerebrovascular accident) ([place]) 06/17/2018   Stroke (cerebrum) ([place]) 06/17/2018   Left hemiparesis ([place]) 07/16/2015   Accelerated hypertension 07/16/2015    Past Surgical History:  Procedure Laterality Date   NO PAST SURGERIES      Prior to Admission medications   Medication Sig Start Date End Date Taking? Authorizing Provider  alum & mag hydroxide-simeth (MAALOX/MYLANTA) 200-200-20 MG/5ML suspension Take 30 mLs by mouth every 4 (four) hours as needed for indigestion or heartburn. 06/19/18   Gladstone Lighter, MD  amLODipine (NORVASC) 10 MG tablet Take 1 tablet (10 mg total) by mouth daily. 06/21/18   Harrie Foreman, MD  aspirin 325 MG tablet Take 1 tablet (325 mg total) by mouth daily. 06/20/18   Gladstone Lighter, MD  calcium carbonate (TUMS -  DOSED IN MG ELEMENTAL CALCIUM) 500 MG chewable tablet Chew 1 tablet (200 mg of elemental calcium total) by mouth as needed for indigestion or heartburn. 06/21/18   Harrie Foreman, MD  chlorproMAZINE (THORAZINE) 25 MG tablet Take 1 tablet (25 mg total) by mouth 3 (three) times daily as needed (hiccups). 06/19/18   Gladstone Lighter, MD  cloNIDine (CATAPRES - DOSED IN MG/24 HR) 0.2 mg/24hr patch Place 1 patch (0.2 mg total) onto the skin once a week. 06/19/18   Gladstone Lighter, MD  LORazepam (ATIVAN) 2 MG/ML injection Inject 1 mL (2 mg total) into the vein every 6 (six) hours as needed (hiccups). 06/19/18   Gladstone Lighter, MD  nicotine (NICODERM CQ - DOSED IN MG/24 HOURS) 21 mg/24hr patch Place 1 patch (21 mg total) onto the skin daily. 06/20/18   Gladstone Lighter, MD  polyethylene glycol (MIRALAX / GLYCOLAX) packet Take 17 g by mouth daily. 06/19/18   Gladstone Lighter, MD  rosuvastatin (CRESTOR) 20 MG tablet Take 1 tablet (20 mg total) by mouth daily. 06/20/18   Gladstone Lighter, MD    Allergies Patient has no known allergies.  Family History  Problem Relation Age of Onset   Stroke Mother    Hypertension Father     Social History Social History   Tobacco Use   Smoking status: Current Every Day Smoker    Types: Cigarettes   Smokeless tobacco: Never Used  Substance Use Topics   Alcohol use: No   Drug use: No    Review  of Systems Unable to obtain secondary to unresponsiveness   ____________________________________________   PHYSICAL EXAM:  VITAL SIGNS: ED Triage Vitals  Enc Vitals Group     BP 05/15/2019 0123 (!) 192/129     Pulse Rate 06/12/2019 0123 (!) 167     Resp 05/22/2019 0123 18     Temp --      Temp src --      SpO2 06/13/2019 0123 (!) 83 %     Weight 06/11/2019 0214 113.4 kg (250 lb)     Height 06/03/2019 0214 1.88 m (_0 )     Head Circumference --      Peak Flow --      Pain Score --      Pain Loc --      Pain Edu? --      Excl. in [place]? --      Constitutional: Unresponsive to verbal/noxious stimuli Eyes: Aniscoria: 4 mm pupil on the right 2 mm pupil on the left Head: Atraumatic. Mouth/Throat: Mucous membranes are moist. Neck: No stridor.  No meningeal signs.   Cardiovascular: Normal rate, regular rhythm. Good peripheral circulation. Grossly normal heart sounds. Respiratory: Normal respiratory effort.  No retractions. Gastrointestinal: Soft and nontender. No distention.  Musculoskeletal: No lower extremity tenderness nor edema. No gross deformities of extremities. Neurologic: Unresponsive to verbal and noxious stimuli.  Aniscoria as listed above Skin:  Skin is warm, dry and intact. Psychiatric: Mood and affect are normal. Speech and behavior are normal.**}  ____________________________________________   LABS (all labs ordered are listed, but only abnormal results are displayed)  Labs Reviewed  COMPREHENSIVE METABOLIC PANEL - Abnormal; Notable for the following components:      Result Value   Potassium 2.9 (*)    CO2 19 (*)    Glucose, Bld 246 (*)    Creatinine, Ser 1.82 (*)    Calcium 8.8 (*)    Total Protein 8.3 (*)    Alkaline Phosphatase 130 (*)    GFR calc non Af Amer 50 (*)    GFR calc Af Amer 58 (*)    Anion gap 17 (*)    All other components within normal limits  CBC WITH DIFFERENTIAL/PLATELET - Abnormal; Notable for the following components:   WBC 12.4 (*)    Lymphs Abs 5.0 (*)    Basophils Absolute 0.2 (*)    All other components within normal limits  URINALYSIS, COMPLETE (UACMP) WITH MICROSCOPIC - Abnormal; Notable for the following components:   Color, Urine YELLOW (*)    APPearance HAZY (*)    Glucose, UA 150 (*)    Hgb urine dipstick SMALL (*)    Ketones, ur 5 (*)    Protein, ur >=300 (*)    All other components within normal limits  BLOOD GAS, ARTERIAL - Abnormal; Notable for the following components:   pH, Arterial 7.33 (*)    pO2, Arterial 155 (*)    Acid-base deficit 5.4 (*)    All  other components within normal limits  TROPONIN I (HIGH SENSITIVITY) - Abnormal; Notable for the following components:   Troponin I (High Sensitivity) 39 (*)    All other components within normal limits  SARS CORONAVIRUS 2 (HOSPITAL ORDER, [place] [facility])  ETHANOL  LIPASE, BLOOD  PROTIME-INR  URINE DRUG SCREEN, QUALITATIVE ([facility] ONLY)  TYPE AND SCREEN  TYPE AND SCREEN  TROPONIN I (HIGH SENSITIVITY)   ____________________________________________  EKG ED ECG REPORT I, Arroyo N Hodges Treiber, the attending physician, personally  viewed and interpreted this ECG.   Date: 05/22/2019  EKG Time: 1:34AM  Rate: 119  Rhythm:Sinus Tachycardia  Axis: Normal  Intervals:Normal  ST&T Change: None  ____________________________________________  RADIOLOGY I, [place] Ernst Bowler, personally viewed and evaluated these images (plain radiographs) as part of my medical decision making, as well as reviewing the written report by the radiologist.  ED MD interpretation:    Official radiology report(s): Ct Cervical Spine Wo Contrast  Result Date: 06/08/2019 CLINICAL DATA:  28 year old male code stroke. EXAM: CT HEAD WITHOUT CONTRAST CT CERVICAL SPINE WITHOUT CONTRAST TECHNIQUE: Multidetector CT imaging of the head and cervical spine was performed following the standard protocol without intravenous contrast. Multiplanar CT image reconstructions of the cervical spine were also generated. COMPARISON:  CTA head and neck 06/18/2018. Brain MRI 06/17/2018 and earlier. FINDINGS: CT HEAD FINDINGS Brain: Lobulated hyperdense intra-axial hemorrhage in the left hemisphere centered at the left deep gray nuclei encompasses 67 by 60 by 45 millimeters (AP by transverse by CC) for an estimated blood volume of 90 milliliters. The hemorrhage tracks cephalad into the left corona radiata and caudal into the left cerebral peduncle. Regional edema and mass effect. Effaced right lateral ventricle. Rightward midline  shift of 9 millimeters. No intraventricular extension of blood. There does appear to be trace subarachnoid extension on the left. Additionally there is downward mass effect of the brain with effaced basilar cisterns and cisterna magna. Minimal trapping of the right lateral ventricle. The 3rd and 4th ventricle are effaced. No superimposed acute cortically based infarct identified. Vascular: No suspicious intracranial vascular hyperdensity. Skull: No acute osseous abnormality identified. Sinuses/Orbits: Visualized paranasal sinuses and mastoids are stable and well pneumatized. Other: No acute orbit or scalp soft tissue findings. CT CERVICAL SPINE FINDINGS Alignment: Improved lordosis compared to the 2019 CTA. Cervicothoracic junction alignment is within normal limits. Bilateral posterior element alignment is within normal limits. Skull base and vertebrae: Visualized skull base is intact. No atlanto-occipital dissociation. No acute osseous abnormality identified. Soft tissues and spinal canal: No prevertebral fluid or swelling. No visible canal hematoma. Endotracheal tube and enteric tubes in place and appear appropriately position. The ETT tip is at the level the clavicles. Disc levels:  Negative. Upper chest: Confluent biapical pulmonary opacity. Visible upper thoracic levels appear intact. IMPRESSION: 1. Fairly large acute intra-axial hemorrhage with epicenter at the left deep gray nuclei extending into the left corona radiata and the midbrain. Estimated intra-axial blood volume 90 mL. Trace subarachnoid extension but no intraventricular extension. 2. Associated brain edema and mass effect including effaced basilar cisterns, rightward midline shift of 9 mm. 3. Visible ET tube and enteric tube appear well placed. Confluent upper lung opacity might reflect aspiration or noncardiogenic pulmonary edema. 4. No acute traumatic injury identified in the cervical spine. Critical Value/emergent results were called by  telephone at the time of interpretation on 05/16/2019 at 2:12 am to Dr. Marjean Donna , who verbally acknowledged these results. Electronically Signed   By: Genevie Ann M.D.   On: 06/05/2019 02:17   Dg Chest Portable 1 View  Result Date: 06/13/2019 CLINICAL DATA:  28 year old male status post intubation. Patient is unresponsive. EXAM: PORTABLE CHEST 1 VIEW COMPARISON:  None. FINDINGS: Endotracheal tube with tip at the level of the thoracic inlet approximately 10 cm above the carina. This can be further advanced by approximately 3 cm for optimal positioning. Enteric tube extends into the left upper abdomen with tip and side-port appear in the stomach. Mild diffuse hazy airspace and interstitial opacities  primarily involving the right lung. There is no pleural effusion or pneumothorax. Borderline cardiomegaly. No acute osseous pathology. IMPRESSION: 1. Endotracheal tube above the carina and enteric tube with tip in the stomach. 2. Mild diffuse interstitial and hazy airspace densities. Electronically Signed   By: Anner Crete M.D.   On: 06/11/2019 02:00   Ct Head Code Stroke Wo Contrast`  Result Date: 06/13/2019 CLINICAL DATA:  28 year old male code stroke. EXAM: CT HEAD WITHOUT CONTRAST CT CERVICAL SPINE WITHOUT CONTRAST TECHNIQUE: Multidetector CT imaging of the head and cervical spine was performed following the standard protocol without intravenous contrast. Multiplanar CT image reconstructions of the cervical spine were also generated. COMPARISON:  CTA head and neck 06/18/2018. Brain MRI 06/17/2018 and earlier. FINDINGS: CT HEAD FINDINGS Brain: Lobulated hyperdense intra-axial hemorrhage in the left hemisphere centered at the left deep gray nuclei encompasses 67 by 60 by 45 millimeters (AP by transverse by CC) for an estimated blood volume of 90 milliliters. The hemorrhage tracks cephalad into the left corona radiata and caudal into the left cerebral peduncle. Regional edema and mass effect. Effaced right  lateral ventricle. Rightward midline shift of 9 millimeters. No intraventricular extension of blood. There does appear to be trace subarachnoid extension on the left. Additionally there is downward mass effect of the brain with effaced basilar cisterns and cisterna magna. Minimal trapping of the right lateral ventricle. The 3rd and 4th ventricle are effaced. No superimposed acute cortically based infarct identified. Vascular: No suspicious intracranial vascular hyperdensity. Skull: No acute osseous abnormality identified. Sinuses/Orbits: Visualized paranasal sinuses and mastoids are stable and well pneumatized. Other: No acute orbit or scalp soft tissue findings. CT CERVICAL SPINE FINDINGS Alignment: Improved lordosis compared to the 2019 CTA. Cervicothoracic junction alignment is within normal limits. Bilateral posterior element alignment is within normal limits. Skull base and vertebrae: Visualized skull base is intact. No atlanto-occipital dissociation. No acute osseous abnormality identified. Soft tissues and spinal canal: No prevertebral fluid or swelling. No visible canal hematoma. Endotracheal tube and enteric tubes in place and appear appropriately position. The ETT tip is at the level the clavicles. Disc levels:  Negative. Upper chest: Confluent biapical pulmonary opacity. Visible upper thoracic levels appear intact. IMPRESSION: 1. Fairly large acute intra-axial hemorrhage with epicenter at the left deep gray nuclei extending into the left corona radiata and the midbrain. Estimated intra-axial blood volume 90 mL. Trace subarachnoid extension but no intraventricular extension. 2. Associated brain edema and mass effect including effaced basilar cisterns, rightward midline shift of 9 mm. 3. Visible ET tube and enteric tube appear well placed. Confluent upper lung opacity might reflect aspiration or noncardiogenic pulmonary edema. 4. No acute traumatic injury identified in the cervical spine. Critical  Value/emergent results were called by telephone at the time of interpretation on 06/12/2019 at 2:12 am to Dr. Marjean Donna , who verbally acknowledged these results. Electronically Signed   By: Genevie Ann M.D.   On: 05/15/2019 02:17    ____________________________________________   PROCEDURES     .Critical Care Performed by: Gregor Hams, MD Authorized by: Gregor Hams, MD   Critical care provider statement:    Critical care time (minutes):  60   Critical care time was exclusive of:  Separately billable procedures and treating other patients (Intracranial Hemorrhage)   Critical care was necessary to treat or prevent imminent or life-threatening deterioration of the following conditions:  CNS failure or compromise   Critical care was time spent personally by me on the following activities:  Development of treatment plan with patient or surrogate, discussions with consultants, evaluation of patient's response to treatment, examination of patient, obtaining history from patient or surrogate, ordering and performing treatments and interventions, ordering and review of laboratory studies, ordering and review of radiographic studies, pulse oximetry, re-evaluation of patient's condition and review of old charts   I assumed direction of critical care for this patient from another provider in my specialty: no   Procedure Name: Intubation Date/Time: 06/06/2019 5:12 AM Performed by: Gregor Hams, MD Pre-anesthesia Checklist: Patient identified, Emergency Drugs available, Suction available and Patient being monitored Preoxygenation: Pre-oxygenation with 100% oxygen Induction Type: IV induction and Rapid sequence Laryngoscope Size: Glidescope, 4 and Mac Tube size: 7.5 mm Number of attempts: 1 Placement Confirmation: ETT inserted through vocal cords under direct vision,  CO2 detector and Breath sounds checked- equal and bilateral Secured at: 23 cm Dental Injury: Teeth and Oropharynx as  per pre-operative assessment  Difficulty Due To: Difficulty was anticipated        ____________________________________________   INITIAL IMPRESSION / MDM / [place] / ED COURSE  As part of my medical decision making, I reviewed the following data within the electronic MEDICAL RECORD NUMBER  28 year old male presenting with above-stated history and physical exam with concern for intracranial pathology including acute hemorrhage given markedly elevated blood pressure and presenting clinical status.  Patient intubated upon arrival to the emergency department.  CT scan revealed "fairly large acute intracranial hemorrhage with estimated 90 mL's of blood noted on CT per radiologist.  I spoke with the patient's father and confirmed above-stated history as well as notified him of the patient's current clinical status.  Patient's father requested that Mr. Laurance Flatten be transferred to [facility] as this is where he has received this care in the past.  Patient discussed with [facility] transfer center and accepted by Dr. Kennith Center at [facility] as I was informed that [facility] does not have any available ICU beds.  Patient initially given 10 mg of IV labetalol upon arrival to the emergency department secondary to markedly elevated blood pressure with concern for acute intracranial hemorrhage.  Patient has required no sedation while in the emergency department.  Nicardipine currently being infused mannitol infusion is completed.  Follow intubation the patient's bed was raised at 30 degrees ____________________________________________  FINAL CLINICAL IMPRESSION(S) / ED DIAGNOSES  Final diagnoses:  Intracranial hemorrhage ([place])     MEDICATIONS GIVEN DURING THIS VISIT:  Medications  propofol (DIPRIVAN) 1000 MG/100ML infusion (has no administration in time range)  pantoprazole (PROTONIX) injection 40 mg (has no administration in time range)  labetalol (NORMODYNE) injection 20 mg (10 mg Intravenous Given  05/14/2019 0125)    And  nicardipine (CARDENE) 64m in 0.86% saline 2079mIV infusion (0.1 mg/ml) (2 mg/hr Intravenous New Bag/Given 06/13/2019 0443)  mannitol 25 % injection 25 g (25 g Intravenous New Bag/Given 05/21/2019 0203)     ED Discharge Orders    None      *Please note:  CrDaley Gosseas evaluated in Emergency Department on 05/25/2019 for the symptoms described in the history of present illness. He was evaluated in the context of the global COVID-19 pandemic, which necessitated consideration that the patient might be at risk for infection with the SARS-CoV-2 virus that causes COVID-19. Institutional protocols and algorithms that pertain to the evaluation of patients at risk for COVID-19 are in a state of rapid change based on information released by regulatory bodies including the CDC and federal  and state organizations. These policies and algorithms were followed during the patient's care in the ED.  Some ED evaluations and interventions may be delayed as a result of limited staffing during the pandemic.*  Note:  This document was prepared using Dragon voice recognition software and may include unintentional dictation errors.   Gregor Hams, MD 06/08/2019 [id number]

## 2019-05-23 NOTE — ED Notes (Signed)
Patient to CT via stretcher with Respiratory, Heather [facility].

## 2019-05-23 NOTE — ED Notes (Signed)
Continues to rest quietly at this time.  Patient still without any movement noted.

## 2019-05-23 NOTE — ED Notes (Signed)
Patient resting quietly at this time, not attempting to pull out ET tube.

## 2019-05-23 NOTE — ED Notes (Signed)
Father at bedside.

## 2019-05-23 NOTE — ED Notes (Signed)
Report called to [facility], given to [place], [place].

## 2019-05-23 NOTE — Progress Notes (Signed)
Patient on current vent settings vt 500 r 22 100% peep 5.  Initially placed on peep of 8 increased to 10 then titrated back to 5 per Dr Owens Shark.  Et tube advanced to 23cm at lip per Dr Owens Shark. Interventions by RT tolerated well.

## 2019-05-23 NOTE — Progress Notes (Signed)
Ch responded to a page regarding pt whose family had arrived to the ER family to provided spiritual and emotional support. Pt's family was informed by the provider that there was significant bleeding from the brain. The pt is in critical condition and will be transferred to [place] prayed for family and escorted father of pt to his car.    Jun 14, 2019 0400  Clinical Encounter Type  Visited With Patient and family together;Health care provider  Visit Type Psychological support;Social support;Spiritual support;Follow-up;Critical Care;ED

## 2019-05-23 NOTE — ED Notes (Signed)
Per family patient usually plays video games until falls asleep.  Father reports he had already gone to bed when his friend heard a thump and thought patient had punched the wall due to being angry, but then heard another noise and when she checked on patient he was laying on the floor unresponsive.  Father also reports that patient had been non-compliant with blood pressure medication due to financial reasons and that he (father) had been trying to share his blood pressure medications but patient had stopped taking them from him.

## 2019-05-23 NOTE — ED Triage Notes (Signed)
Patient to ED rm 11 via EMS from home.  Per EMS patient had been playing video games prior around 12:30 am and became unresponsive for family.  Per EMS patient was unresponsive on their arrival with left arm appearing to posture.  Enroute to ED patient became apneic.  EMS CBG 176, saline loc to left antecub via 18 g angiocath.

## 2019-06-14 DEATH — deceased

## 2020-03-03 IMAGING — CT CT CERVICAL SPINE WITHOUT CONTRAST
3 of 7 series · 10 of 33 positions shown, 12 images · non-contrast
Comparison: CTA head and neck 06/18/2018. Brain MRI 06/17/2018 and
earlier.

CLINICAL DATA: 28-year-old male code stroke.

EXAM:
CT HEAD WITHOUT CONTRAST
CT CERVICAL SPINE WITHOUT CONTRAST
TECHNIQUE: Multidetector CT imaging of the head and cervical spine was
performed following the standard protocol without intravenous
contrast. Multiplanar CT image reconstructions of the cervical spine
were also generated.

[Series 8: sagittal bone · sagittal · 0.25mm/px · 4 of 53 slices shown]
[im 11/53  bone]
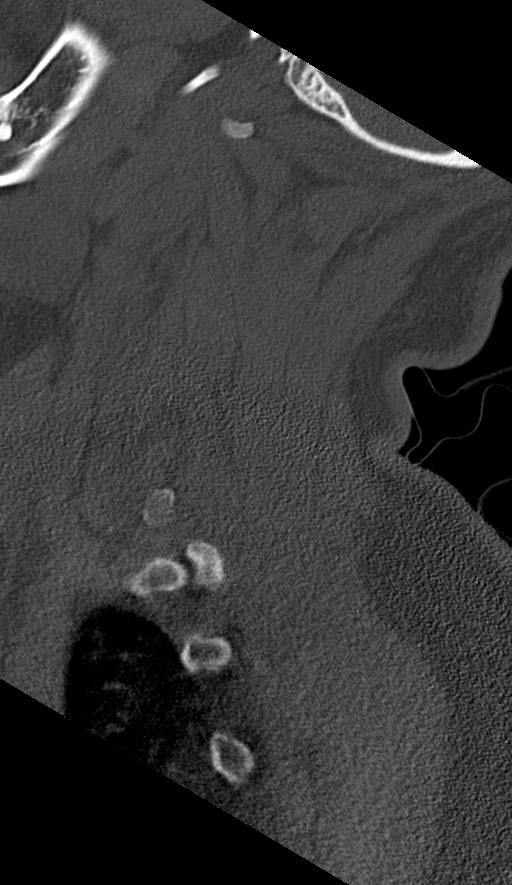
[im 21/53  bone]
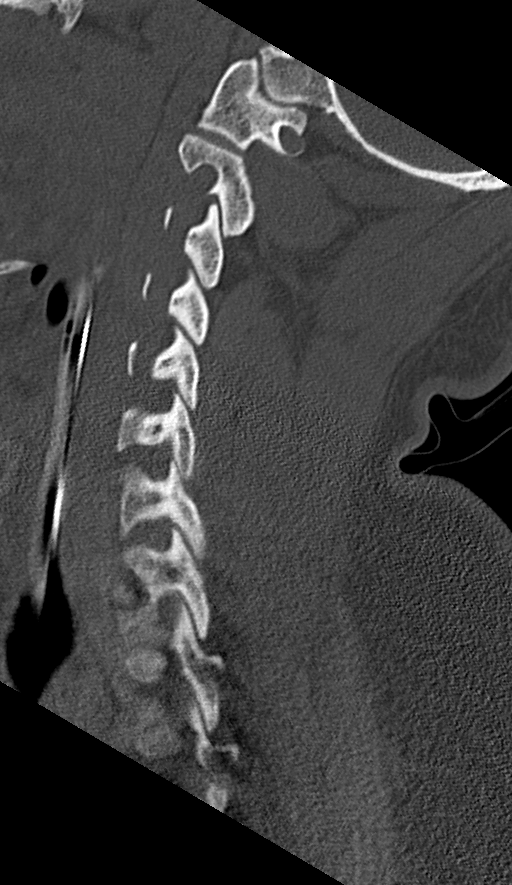
[im 32/53  bone]
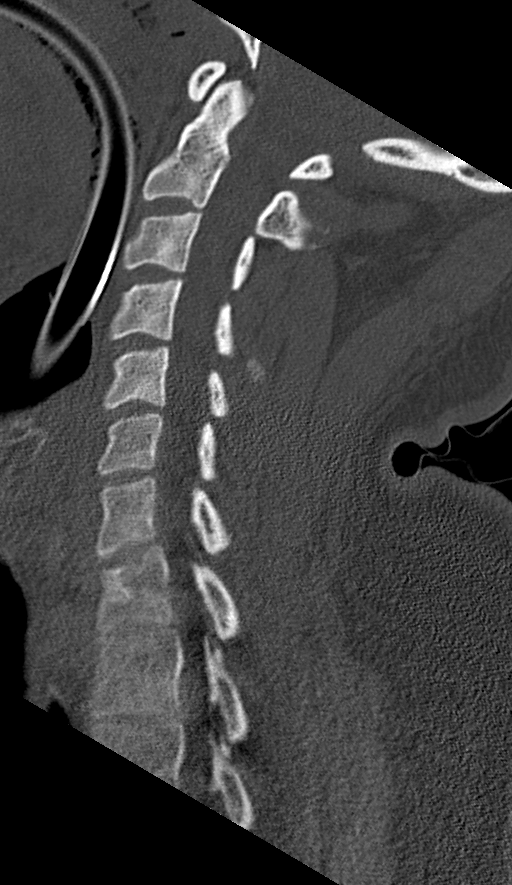
[im 42/53  bone]
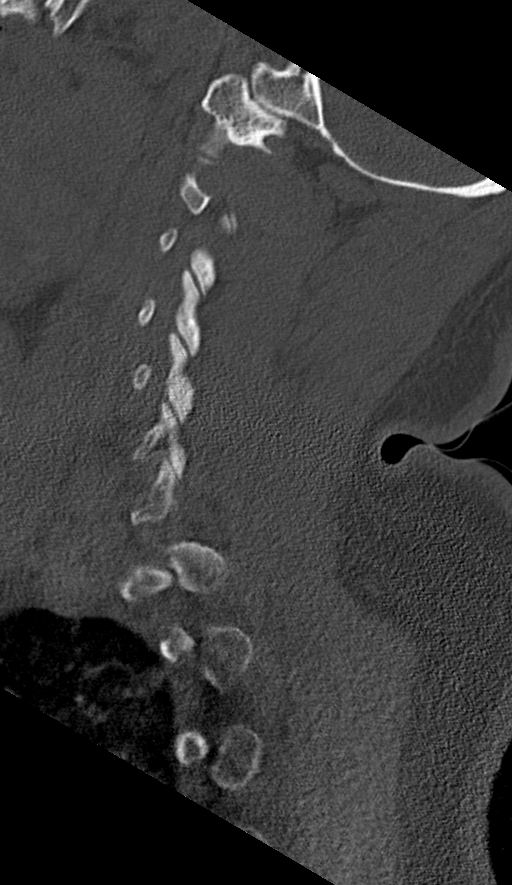

[Series 9: coronal bone · coronal · 0.21mm/px · 1 of 51 slices shown]
[im 26/51  bone]
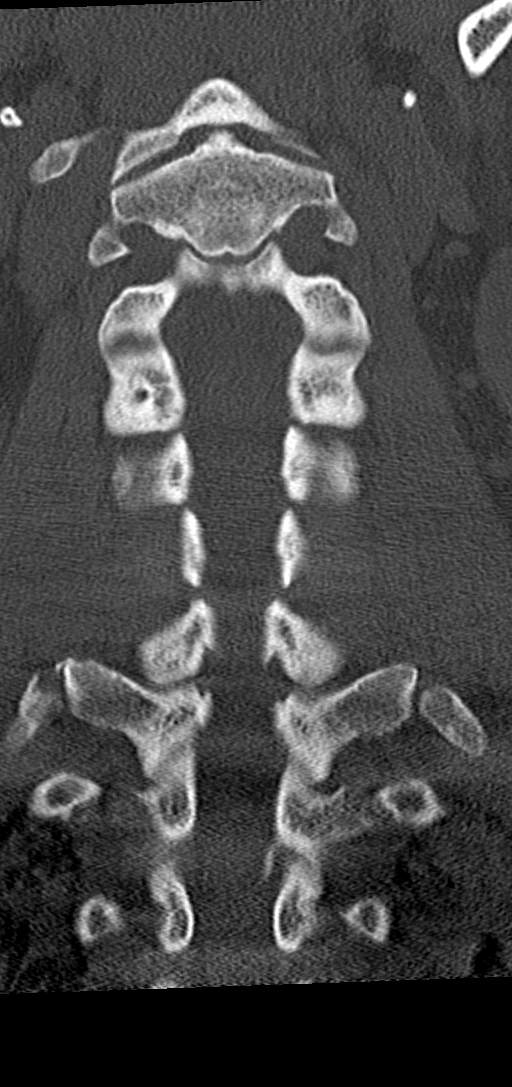

[Series 10: orthogonal bone · axial · 0.21mm/px · z∈[-319,-192]mm · 5 of 112 slices shown, 7 images]
[im 19/112  soft-tissue]
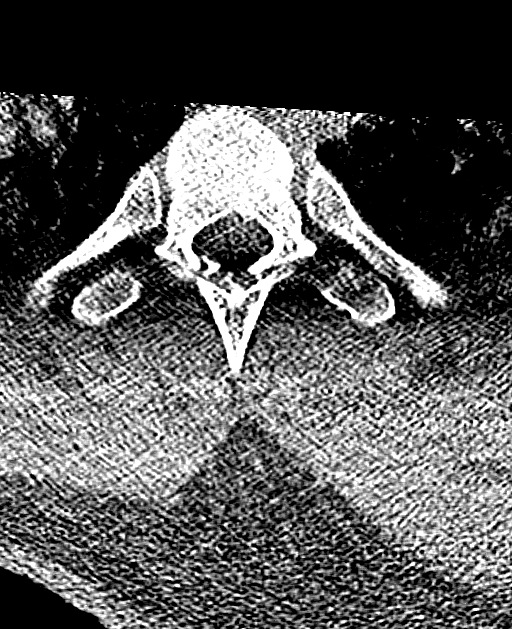
[im 19/112  bone]
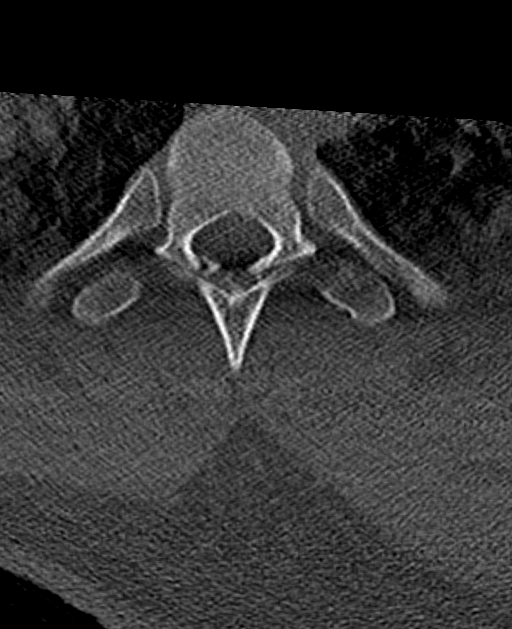
[im 38/112  bone]
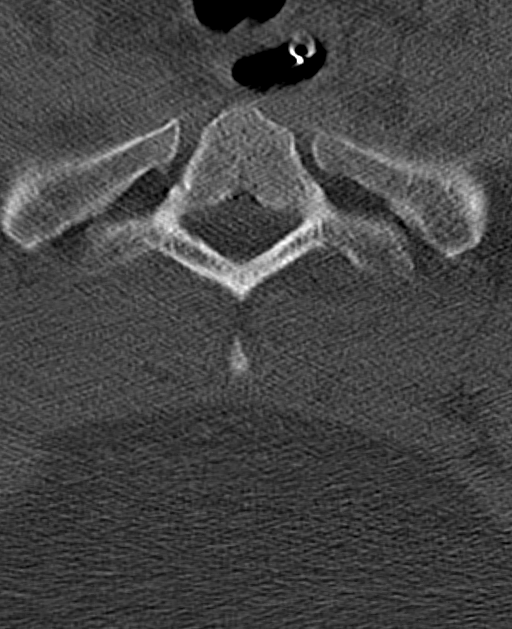
[im 56/112  bone]
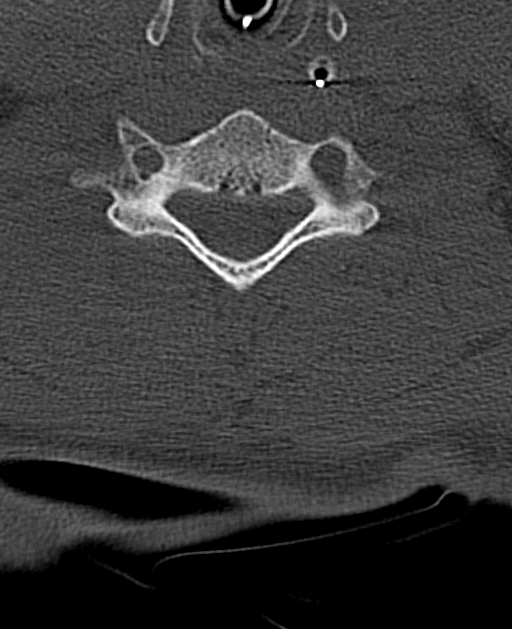
[im 75/112  bone]
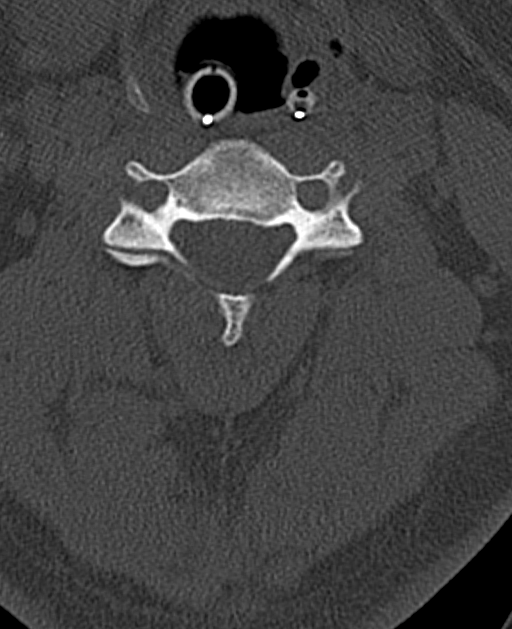
[im 93/112  soft-tissue]
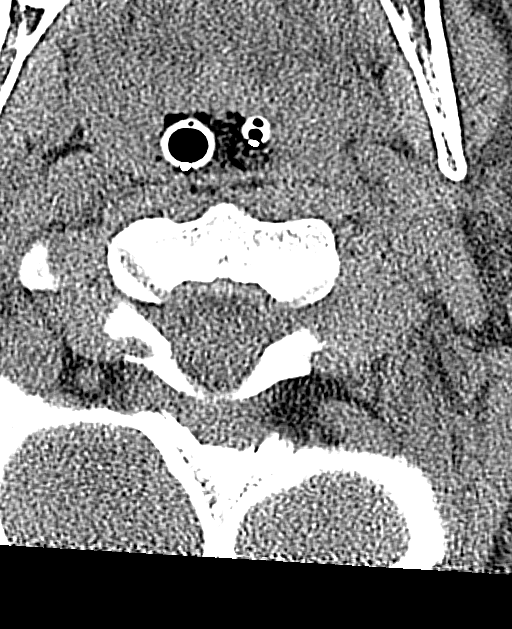
[im 93/112  bone]
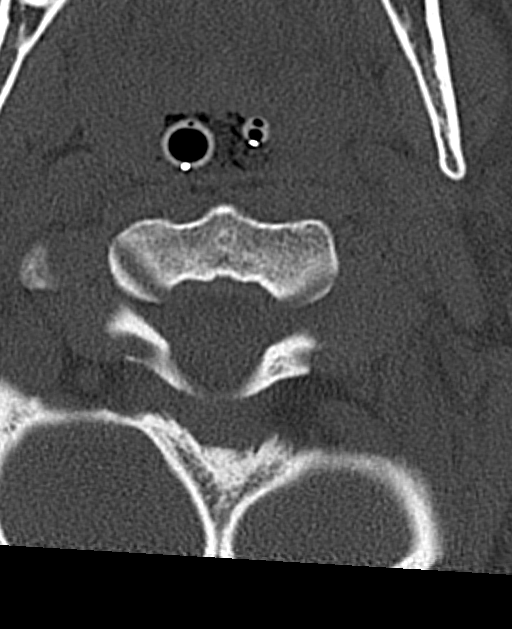

[10 of 33 positions shown; findings below may reference images not displayed]

FINDINGS: CT HEAD FINDINGS

Brain: Lobulated hyperdense intra-axial hemorrhage in the left
hemisphere centered at the left deep gray nuclei encompasses 67 by
60 by 45 millimeters (AP by transverse by CC) for an estimated blood
volume of 90 milliliters. The hemorrhage tracks cephalad into the
left corona radiata and caudal into the left cerebral peduncle.

Regional edema and mass effect. Effaced right lateral ventricle.
Rightward midline shift of 9 millimeters. No intraventricular
extension of blood. There does appear to be trace subarachnoid
extension on the left.

Additionally there is downward mass effect of the brain with effaced
basilar cisterns and cisterna magna. Minimal trapping of the right
lateral ventricle. The 3rd and 4th ventricle are effaced.

No superimposed acute cortically based infarct identified.

Vascular: No suspicious intracranial vascular hyperdensity.

Skull: No acute osseous abnormality identified.

Sinuses/Orbits: Visualized paranasal sinuses and mastoids are stable
and well pneumatized.

Other: No acute orbit or scalp soft tissue findings.

CT CERVICAL SPINE FINDINGS

Alignment: Improved lordosis compared to the 1570 CTA.
Cervicothoracic junction alignment is within normal limits.
Bilateral posterior element alignment is within normal limits.

Skull base and vertebrae: Visualized skull base is intact. No
atlanto-occipital dissociation. No acute osseous abnormality
identified.

Soft tissues and spinal canal: No prevertebral fluid or swelling. No
visible canal hematoma.

Endotracheal tube and enteric tubes in place and appear
appropriately position. The ETT tip is at the level the clavicles.

Disc levels:  Negative.

Upper chest: Confluent biapical pulmonary opacity. Visible upper
thoracic levels appear intact.
IMPRESSION: 1. Fairly large acute intra-axial hemorrhage with epicenter at the
left deep gray nuclei extending into the left corona radiata and the
midbrain. Estimated intra-axial blood volume 90 mL. Trace
subarachnoid extension but no intraventricular extension.
2. Associated brain edema and mass effect including effaced basilar
cisterns, rightward midline shift of 9 mm.
3. Visible ET tube and enteric tube appear well placed. Confluent
upper lung opacity might reflect aspiration or noncardiogenic
pulmonary edema.
4. No acute traumatic injury identified in the cervical spine.

Critical Value/emergent results were called by telephone at the time
of interpretation on 05/23/2019 at [DATE] to Dr. ZULAY SCRANTON ,
who verbally acknowledged these results.
